# Patient Record
Sex: Male | Born: 1940 | Race: Black or African American | Hispanic: No | Marital: Married | Smoking: Former smoker
Health system: Southern US, Community
[De-identification: ages and names within clinical notes are randomized; demographics above are authoritative.]

## PROBLEM LIST (undated history)

## (undated) DIAGNOSIS — M1990 Unspecified osteoarthritis, unspecified site: Secondary | ICD-10-CM

## (undated) DIAGNOSIS — K219 Gastro-esophageal reflux disease without esophagitis: Secondary | ICD-10-CM

## (undated) HISTORY — DX: Unspecified osteoarthritis, unspecified site: M19.90

---

## 1988-02-17 HISTORY — PX: HERNIA REPAIR: SHX51

## 1998-03-12 ENCOUNTER — Emergency Department (HOSPITAL_COMMUNITY): Admission: EM | Admit: 1998-03-12 | Discharge: 1998-03-12 | Payer: Self-pay | Admitting: Emergency Medicine

## 1999-09-11 ENCOUNTER — Encounter: Payer: Self-pay | Admitting: Emergency Medicine

## 1999-09-11 ENCOUNTER — Emergency Department (HOSPITAL_COMMUNITY): Admission: EM | Admit: 1999-09-11 | Discharge: 1999-09-11 | Payer: Self-pay | Admitting: Emergency Medicine

## 1999-10-06 ENCOUNTER — Encounter: Admission: RE | Admit: 1999-10-06 | Discharge: 1999-10-22 | Payer: Self-pay | Admitting: Occupational Medicine

## 2004-02-09 ENCOUNTER — Emergency Department: Payer: Self-pay | Admitting: Unknown Physician Specialty

## 2004-03-24 HISTORY — PX: HERNIA REPAIR: SHX51

## 2004-04-29 ENCOUNTER — Emergency Department: Payer: Self-pay | Admitting: Emergency Medicine

## 2005-12-09 ENCOUNTER — Emergency Department: Payer: Self-pay | Admitting: Emergency Medicine

## 2006-12-09 ENCOUNTER — Emergency Department (HOSPITAL_COMMUNITY): Admission: EM | Admit: 2006-12-09 | Discharge: 2006-12-09 | Payer: Self-pay | Admitting: Emergency Medicine

## 2006-12-12 ENCOUNTER — Emergency Department: Payer: Self-pay | Admitting: Emergency Medicine

## 2007-04-01 ENCOUNTER — Encounter: Admission: RE | Admit: 2007-04-01 | Discharge: 2007-04-01 | Payer: Self-pay | Admitting: General Surgery

## 2007-04-04 ENCOUNTER — Ambulatory Visit (HOSPITAL_BASED_OUTPATIENT_CLINIC_OR_DEPARTMENT_OTHER): Admission: RE | Admit: 2007-04-04 | Discharge: 2007-04-04 | Payer: Self-pay | Admitting: General Surgery

## 2009-01-26 ENCOUNTER — Emergency Department: Payer: Self-pay | Admitting: Emergency Medicine

## 2009-02-26 ENCOUNTER — Emergency Department: Payer: Self-pay | Admitting: Unknown Physician Specialty

## 2009-03-15 ENCOUNTER — Emergency Department: Payer: Self-pay | Admitting: Emergency Medicine

## 2009-05-12 ENCOUNTER — Emergency Department: Payer: Self-pay | Admitting: Emergency Medicine

## 2009-06-25 ENCOUNTER — Emergency Department: Payer: Self-pay | Admitting: Emergency Medicine

## 2009-11-16 ENCOUNTER — Emergency Department: Payer: Self-pay | Admitting: Internal Medicine

## 2010-06-26 ENCOUNTER — Emergency Department: Payer: Self-pay | Admitting: Emergency Medicine

## 2010-06-28 ENCOUNTER — Emergency Department (HOSPITAL_BASED_OUTPATIENT_CLINIC_OR_DEPARTMENT_OTHER)
Admission: EM | Admit: 2010-06-28 | Discharge: 2010-06-28 | Disposition: A | Discharge status: Home / Self Care | Payer: Medicare Other | Attending: Emergency Medicine | Admitting: Emergency Medicine

## 2010-06-28 DIAGNOSIS — H9209 Otalgia, unspecified ear: Secondary | ICD-10-CM | POA: Insufficient documentation

## 2010-06-28 DIAGNOSIS — H6120 Impacted cerumen, unspecified ear: Secondary | ICD-10-CM | POA: Insufficient documentation

## 2010-06-28 DIAGNOSIS — H6690 Otitis media, unspecified, unspecified ear: Secondary | ICD-10-CM | POA: Insufficient documentation

## 2010-07-01 NOTE — Op Note (Signed)
NAME:  Kevin [name], Kevin [name] NO.:  [id number]   MEDICAL RECORD NO.:  [id number]          PATIENT TYPE:  AMB   LOCATION:  DSC                          FACILITY:  [facility]   PHYSICIAN:  Leonie Man, M.D.   DATE OF BIRTH:  1940-03-01   DATE OF PROCEDURE:  04/04/2007  DATE OF DISCHARGE:  04/04/2007                               OPERATIVE REPORT   PREOPERATIVE DIAGNOSIS:  Left inguinal hernia.   POSTOPERATIVE DIAGNOSIS:  Left direct inguinal hernia.   PROCEDURE:  Left inguinal hernia repair with mesh.   SURGEON:  Leonie Man, M.D.   ASSISTANT:  OR tech.   ANESTHESIA:  General.   FINDINGS:  Direct left inguinal hernia.   SPECIMENS:  None.   The patient returned to the PACU in excellent condition.   NOTE:  The patient is a 70 year old man presenting with large left sided  inguinal hernia.  He had undergone right groin hernia repair in the  remote past.  He comes to the operating room now after risks and  potential benefits of surgery have been discussed.  All questions  answered and consent obtained.   PROCEDURE:  With the patient positioned supinely following induction of  satisfactory general anesthesia, the left groin is prepped and draped to  be included in a sterile operative field.  Transverse incision in lower  abdominal crease deepened through the skin and subcutaneous tissue,  carried down to the external oblique aponeurosis.  This was opened up  through the external inguinal ring and the spermatic cord is then  elevated and held with a Penrose drain.  The ilioinguinal nerve was  protected throughout the course of dissection.  Dissection along the  anterior aspect of the spermatic cord revealed only a small lipoma which  was removed and discarded.  There was no indirect sac.  A large direct  sac was then extending from the pubic tubercle all the way up to the  internal ring, was dissected free from the cord and the sac was  invaginated into the  retroperitoneum.  I repaired the defect with an  onlay patch of polypropylene mesh which was sewn in at the pubic  tubercle, carried up along the conjoined tendon to the internal ring and  again from the pubic tubercle up along the shelving edge of Poupart's  ligament to the internal ring.  The mesh was split so as to allow the  spermatic cord to protrude through the leaflets.  The tails of the  leaflets were then sewn down behind the cord into the internal oblique  muscle.  All areas of dissection again checked for hemostasis, noted to  be dry.  Sponge and instrument counts were verified.  The external  oblique aponeurosis closed over the cord reapproximating the external  inguinal ring with 2-0 Vicryl sutures.  The Scarpa's fascia was closed  with 3-0 Vicryl  and skin closed with 4-0 Monocryl suture and then reinforced with Steri-  Strips.  Sterile dressings were applied.  Anesthetic reversed.  The  patient removed from the operating room to the recovery room in stable  condition.  He tolerated the procedure well.      Leonie Man, M.D.  Electronically Signed     PB/MEDQ  D:  04/04/2007  T:  04/05/2007  Job:  [id number]

## 2010-07-21 ENCOUNTER — Emergency Department: Payer: Self-pay | Admitting: Emergency Medicine

## 2010-09-04 ENCOUNTER — Emergency Department: Payer: Self-pay | Admitting: Emergency Medicine

## 2010-11-07 LAB — CBC
HCT: 39.1
Hemoglobin: 13
MCHC: 33.3
MCV: 83.7
Platelets: 167
RBC: 4.67
RDW: 13.2
WBC: 3.4 — ABNORMAL LOW

## 2010-11-07 LAB — DIFFERENTIAL
Basophils Absolute: 0
Basophils Relative: 1
Eosinophils Absolute: 0.1
Eosinophils Relative: 2
Lymphocytes Relative: 42
Lymphs Abs: 1.4
Monocytes Absolute: 0.3
Monocytes Relative: 9
Neutro Abs: 1.6 — ABNORMAL LOW
Neutrophils Relative %: 46

## 2010-11-07 LAB — BASIC METABOLIC PANEL
BUN: 10
CO2: 26
Calcium: 8.6
Chloride: 107
Creatinine, Ser: 0.93
GFR calc Af Amer: >60
GFR calc non Af Amer: >60
Glucose, Bld: 97
Potassium: 4
Sodium: 138

## 2010-11-20 ENCOUNTER — Emergency Department: Payer: Self-pay | Admitting: *Deleted

## 2011-07-20 ENCOUNTER — Emergency Department: Payer: Self-pay | Admitting: Unknown Physician Specialty

## 2011-10-03 ENCOUNTER — Emergency Department: Payer: Self-pay | Admitting: *Deleted

## 2012-02-20 ENCOUNTER — Emergency Department: Payer: Self-pay | Admitting: Internal Medicine

## 2013-04-17 ENCOUNTER — Emergency Department: Payer: Self-pay | Admitting: Emergency Medicine

## 2013-06-23 ENCOUNTER — Emergency Department: Payer: Self-pay | Admitting: Emergency Medicine

## 2015-05-29 ENCOUNTER — Ambulatory Visit (INDEPENDENT_AMBULATORY_CARE_PROVIDER_SITE_OTHER): Payer: Medicare HMO | Admitting: General Surgery

## 2015-05-29 ENCOUNTER — Encounter: Payer: Self-pay | Admitting: General Surgery

## 2015-05-29 VITALS — BP 138/68 | HR 70 | Resp 14 | Ht 69.0 in | Wt 162.0 lb

## 2015-05-29 DIAGNOSIS — K921 Melena: Secondary | ICD-10-CM

## 2015-05-29 LAB — POC HEMOCCULT BLD/STL (OFFICE/1-CARD/DIAGNOSTIC): Fecal Occult Blood, POC: POSITIVE — AB

## 2015-05-29 MED ORDER — POLYETHYLENE GLYCOL 3350 17 GM/SCOOP PO POWD: ORAL | Status: DC

## 2015-05-29 NOTE — Patient Instructions (Addendum)
Colonoscopy A colonoscopy is an exam to look at the entire large intestine (colon). This exam can help find problems such as tumors, polyps, inflammation, and areas of bleeding. The exam takes about 1 hour.  LET [facility]OUR [facility]ARE PROVIDER KNOW ABOUT:   Any allergies you have.  All medicines you are taking, including vitamins, herbs, eye drops, creams, and over-the-counter medicines.  Previous problems you or members of your family have had with the use of anesthetics.  Any blood disorders you have.  Previous surgeries you have had.  Medical conditions you have. RISKS AND COMPLICATIONS  Generally, this is a safe procedure. However, as with any procedure, complications can occur. Possible complications include:  Bleeding.  Tearing or rupture of the colon wall.  Reaction to medicines given during the exam.  Infection (rare). BEFORE THE PROCEDURE   Ask your health care provider about changing or stopping your regular medicines.  You may be prescribed an oral bowel prep. This involves drinking a large amount of medicated liquid, starting the day before your procedure. The liquid will cause you to have multiple loose stools until your stool is almost clear or light green. This cleans out your colon in preparation for the procedure.  Do not eat or drink anything else once you have started the bowel prep, unless your health care provider tells you it is safe to do so.  Arrange for someone to drive you home after the procedure. PROCEDURE   You will be given medicine to help you relax (sedative).  You will lie on your side with your knees bent.  A long, flexible tube with a light and camera on the end (colonoscope) will be inserted through the rectum and into the colon. The camera sends video back to a computer screen as it moves through the colon. The colonoscope also releases carbon dioxide gas to inflate the colon. This helps your health care provider see the area better.  During  the exam, your health care provider may take a small tissue sample (biopsy) to be examined under a microscope if any abnormalities are found.  The exam is finished when the entire colon has been viewed. AFTER THE PROCEDURE   Do not drive for 24 hours after the exam.  You may have a small amount of blood in your stool.  You may pass moderate amounts of gas and have mild abdominal cramping or bloating. This is caused by the gas used to inflate your colon during the exam.  Ask when your test results will be ready and how you will get your results. Make sure you get your test results.   This information is not intended to replace advice given to you by your health care provider. Make sure you discuss any questions you have with your health care provider.   Document Released: 01/31/2000 Document Revised: 11/23/2012 Document Reviewed: 10/10/2012 Elsevier Interactive Patient Education [employer]2016 Elsevier Inc.  Patient [facility]

## 2015-05-29 NOTE — Progress Notes (Signed)
Patient ID: Kevin [name], male   DOB: 05-Sep-1940, 75 y.o.   MRN: [id number]  Chief Complaint  Patient presents with  . Other    blood in stool    HPI Kevin [name] is a 75 y.o. male here due to blood in stool. He first noticed this on Saturday April 8th. He reports noticing it when he wiped. He also reports some blood in the toilet bowl. He reports no pain, just some stomach growling. He has had this every time his bowels move since then. No prior history of GI issues. Has not had a colonoscopy in more than 10 yrs. I have reviewed the history of present illness with the patient.  HPI  Past Medical History  Diagnosis Date  . Arthritis     Past Surgical History  Procedure Laterality Date  . Hernia repair Left 03/24/2004    inguinal     Family History  Problem Relation Age of Onset  . Ovarian cancer Mother   . Alcohol abuse Father     Social History Social History  Substance Use Topics  . Smoking status: Former Smoker    Quit date: 02/16/2009  . Smokeless tobacco: Never Used  . Alcohol Use: 0.0 oz/week    0 Standard drinks or equivalent per week    Allergies  Allergen Reactions  . Shellfish Allergy     Current Outpatient Prescriptions  Medication Sig Dispense Refill  . meloxicam (MOBIC) 15 MG tablet Take 15 mg by mouth daily.  0  . mirtazapine (REMERON) 15 MG tablet Take 15 mg by mouth at bedtime.  0  . polyethylene glycol powder (GLYCOLAX/MIRALAX) powder 255 grams one bottle for colonoscopy prep 255 g 0   No current facility-administered medications for this visit.    Review of Systems Review of Systems  Constitutional: Negative.   Respiratory: Negative.   Cardiovascular: Negative.   Gastrointestinal: Positive for blood in stool. Negative for nausea, vomiting, abdominal pain, diarrhea, constipation, abdominal distention, anal bleeding and rectal pain.    Blood pressure 138/68, pulse 70, resp. rate 14, height 5' 9" (1.753 m), weight 162 lb (73.483  kg).  Physical Exam Physical Exam  Constitutional: He is oriented to person, place, and time. He appears well-developed and well-nourished.  Eyes: Conjunctivae are normal. No scleral icterus.  Neck: Neck supple.  Cardiovascular: Normal rate, regular rhythm and normal heart sounds.   Pulmonary/Chest: Effort normal and breath sounds normal.  Abdominal: Soft. Normal appearance and bowel sounds are normal. There is no hepatomegaly. There is no tenderness. No hernia.  Genitourinary: Rectal exam shows no external hemorrhoid and no internal hemorrhoid. Guaiac positive stool.  Digital exam- no palpable nodule or mass. Stool was soft, maroon colored- heme pos.  Lymphadenopathy:    He has no cervical adenopathy.  Neurological: He is alert and oriented to person, place, and time.  Skin: Skin is warm and dry.  Psychiatric: He has a normal mood and affect.    Data Reviewed Notes reviewed   Assessment    Likely diverticular bleed. Pt will benefit with colonoscopy. Advised on this and he is agreeable.  He has not had any recent labs- will check CBC, met C.     Plan   Colonoscopy with possible biopsy/polypectomy prn: Information regarding the procedure, including its potential risks and complications (including but not limited to perforation of the bowel, which may require emergency surgery to repair, and bleeding) was verbally given to the patient. Educational information regarding lower intestinal endoscopy was  given to the patient. Written instructions for how to complete the bowel prep using Miralax were provided. The importance of drinking ample fluids to avoid dehydration as a result of the prep emphasized.     Patient has been scheduled for a colonoscopy on 06-05-15 at [facility].   PCP: Nicky Pugh This has been scribed by Lesly Rubenstein LPN    Christene Lye 05/30/2015, 3:08 PM

## 2015-05-30 ENCOUNTER — Encounter: Payer: Self-pay | Admitting: General Surgery

## 2015-05-30 ENCOUNTER — Telehealth: Payer: Self-pay | Admitting: *Deleted

## 2015-05-30 LAB — CBC WITH DIFFERENTIAL/PLATELET
BASOS: 0 %
Basophils Absolute: 0 10*3/uL (ref 0.0–0.2)
EOS (ABSOLUTE): 0 10*3/uL (ref 0.0–0.4)
EOS: 1 %
HEMATOCRIT: 41 % (ref 37.5–51.0)
HEMOGLOBIN: 13.2 g/dL (ref 12.6–17.7)
Immature Grans (Abs): 0 10*3/uL (ref 0.0–0.1)
Immature Granulocytes: 0 %
LYMPHS ABS: 1.7 10*3/uL (ref 0.7–3.1)
Lymphs: 34 %
MCH: 27.7 pg (ref 26.6–33.0)
MCHC: 32.2 g/dL (ref 31.5–35.7)
MCV: 86 fL (ref 79–97)
MONOS ABS: 0.5 10*3/uL (ref 0.1–0.9)
Monocytes: 11 %
Neutrophils Absolute: 2.8 10*3/uL (ref 1.4–7.0)
Neutrophils: 54 %
Platelets: 233 10*3/uL (ref 150–379)
RBC: 4.76 x10E6/uL (ref 4.14–5.80)
RDW: 13.2 % (ref 12.3–15.4)
WBC: 5.1 10*3/uL (ref 3.4–10.8)

## 2015-05-30 LAB — COMPREHENSIVE METABOLIC PANEL
ALBUMIN: 4.7 g/dL (ref 3.5–4.8)
ALK PHOS: 49 IU/L (ref 39–117)
ALT: 16 IU/L (ref 0–44)
AST: 22 IU/L (ref 0–40)
Albumin/Globulin Ratio: 1.9 (ref 1.2–2.2)
BUN/Creatinine Ratio: 13 (ref 10–24)
BUN: 15 mg/dL (ref 8–27)
Bilirubin Total: 0.5 mg/dL (ref 0.0–1.2)
CALCIUM: 9.7 mg/dL (ref 8.6–10.2)
CO2: 26 mmol/L (ref 18–29)
CREATININE: 1.13 mg/dL (ref 0.76–1.27)
Chloride: 102 mmol/L (ref 96–106)
GFR calc Af Amer: 74 mL/min/{1.73_m2} (ref >59)
GFR calc non Af Amer: 64 mL/min/{1.73_m2} (ref >59)
Globulin, Total: 2.5 g/dL (ref 1.5–4.5)
Glucose: 88 mg/dL (ref 65–99)
Potassium: 4.5 mmol/L (ref 3.5–5.2)
Sodium: 143 mmol/L (ref 134–144)
Total Protein: 7.2 g/dL (ref 6.0–8.5)

## 2015-05-30 NOTE — Telephone Encounter (Signed)
Notified patient as instructed, patient pleased. Discussed follow-up appointments, patient agrees  

## 2015-05-30 NOTE — Telephone Encounter (Signed)
-----   Message from Kieth BrightlySeeplaputhur G Sankar, MD sent at 05/30/2015  7:53 AM EDT ----- Inform pt labs are normal.

## 2015-05-30 NOTE — Progress Notes (Signed)
Quick Note:  Inform pt labs are normal. ______ 

## 2015-05-30 NOTE — Telephone Encounter (Signed)
-----   Message from Seeplaputhur G Sankar, MD sent at 05/30/2015  7:53 AM EDT ----- Inform pt labs are normal. 

## 2015-06-05 ENCOUNTER — Encounter
Admission: RE | Disposition: A | Discharge status: Home / Self Care | Payer: Self-pay | Source: Ambulatory Visit | Attending: General Surgery

## 2015-06-05 ENCOUNTER — Ambulatory Visit: Payer: Medicare HMO | Admitting: Anesthesiology

## 2015-06-05 ENCOUNTER — Encounter: Payer: Self-pay | Admitting: *Deleted

## 2015-06-05 ENCOUNTER — Ambulatory Visit
Admission: RE | Admit: 2015-06-05 | Discharge: 2015-06-05 | Disposition: A | Discharge status: Home / Self Care | Payer: Medicare HMO | Source: Ambulatory Visit | Attending: General Surgery | Admitting: General Surgery

## 2015-06-05 DIAGNOSIS — K5730 Diverticulosis of large intestine without perforation or abscess without bleeding: Secondary | ICD-10-CM | POA: Diagnosis not present

## 2015-06-05 DIAGNOSIS — Z811 Family history of alcohol abuse and dependence: Secondary | ICD-10-CM | POA: Diagnosis not present

## 2015-06-05 DIAGNOSIS — Z791 Long term (current) use of non-steroidal anti-inflammatories (NSAID): Secondary | ICD-10-CM | POA: Insufficient documentation

## 2015-06-05 DIAGNOSIS — Z8041 Family history of malignant neoplasm of ovary: Secondary | ICD-10-CM | POA: Diagnosis not present

## 2015-06-05 DIAGNOSIS — Z9889 Other specified postprocedural states: Secondary | ICD-10-CM | POA: Diagnosis not present

## 2015-06-05 DIAGNOSIS — M1990 Unspecified osteoarthritis, unspecified site: Secondary | ICD-10-CM | POA: Diagnosis not present

## 2015-06-05 DIAGNOSIS — K921 Melena: Secondary | ICD-10-CM | POA: Diagnosis not present

## 2015-06-05 DIAGNOSIS — Z87891 Personal history of nicotine dependence: Secondary | ICD-10-CM | POA: Diagnosis not present

## 2015-06-05 DIAGNOSIS — Z79899 Other long term (current) drug therapy: Secondary | ICD-10-CM | POA: Insufficient documentation

## 2015-06-05 DIAGNOSIS — K625 Hemorrhage of anus and rectum: Secondary | ICD-10-CM | POA: Diagnosis present

## 2015-06-05 DIAGNOSIS — Z91013 Allergy to seafood: Secondary | ICD-10-CM | POA: Diagnosis not present

## 2015-06-05 HISTORY — PX: COLONOSCOPY WITH PROPOFOL: SHX5780

## 2015-06-05 SURGERY — COLONOSCOPY WITH PROPOFOL
Anesthesia: General

## 2015-06-05 MED ORDER — PROPOFOL 10 MG/ML IV BOLUS
INTRAVENOUS | Status: DC | PRN
  Administered 2015-06-05: 75 mg via INTRAVENOUS

## 2015-06-05 MED ORDER — SODIUM CHLORIDE 0.9 % IV SOLN
INTRAVENOUS | Status: DC
  Administered 2015-06-05: 1000 mL via INTRAVENOUS

## 2015-06-05 MED ORDER — PROPOFOL 500 MG/50ML IV EMUL
INTRAVENOUS | Status: DC | PRN
  Administered 2015-06-05: 100 ug/kg/min via INTRAVENOUS

## 2015-06-05 MED ORDER — LACTATED RINGERS IV SOLN
INTRAVENOUS | Status: DC | PRN
  Administered 2015-06-05: 09:00:00 via INTRAVENOUS

## 2015-06-05 NOTE — H&P (View-Only) (Signed)
Patient ID: Kevin [name], male   DOB: 09/03/1940, 74 y.o.   MRN: [id number]  Chief Complaint  Patient presents with  . Other    blood in stool    HPI Kevin [name] is a 74 y.o. male here due to blood in stool. He first noticed this on Saturday April 8th. He reports noticing it when he wiped. He also reports some blood in the toilet bowl. He reports no pain, just some stomach growling. He has had this every time his bowels move since then. No prior history of GI issues. Has not had a colonoscopy in more than 10 yrs. I have reviewed the history of present illness with the patient.  HPI  Past Medical History  Diagnosis Date  . Arthritis     Past Surgical History  Procedure Laterality Date  . Hernia repair Left 03/24/2004    inguinal     Family History  Problem Relation Age of Onset  . Ovarian cancer Mother   . Alcohol abuse Father     Social History Social History  Substance Use Topics  . Smoking status: Former Smoker    Quit date: 02/16/2009  . Smokeless tobacco: Never Used  . Alcohol Use: 0.0 oz/week    0 Standard drinks or equivalent per week    Allergies  Allergen Reactions  . Shellfish Allergy     Current Outpatient Prescriptions  Medication Sig Dispense Refill  . meloxicam (MOBIC) 15 MG tablet Take 15 mg by mouth daily.  0  . mirtazapine (REMERON) 15 MG tablet Take 15 mg by mouth at bedtime.  0  . polyethylene glycol powder (GLYCOLAX/MIRALAX) powder 255 grams one bottle for colonoscopy prep 255 [name] 0   No current facility-administered medications for this visit.    Review of Systems Review of Systems  Constitutional: Negative.   Respiratory: Negative.   Cardiovascular: Negative.   Gastrointestinal: Positive for blood in stool. Negative for nausea, vomiting, abdominal pain, diarrhea, constipation, abdominal distention, anal bleeding and rectal pain.    Blood pressure 138/68, pulse 70, resp. rate 14, height 5' 9" (1.753 m), weight 162 lb (73.483  kg).  Physical Exam Physical Exam  Constitutional: He is oriented to person, place, and time. He appears well-developed and well-nourished.  Eyes: Conjunctivae are normal. No scleral icterus.  Neck: Neck supple.  Cardiovascular: Normal rate, regular rhythm and normal heart sounds.   Pulmonary/Chest: Effort normal and breath sounds normal.  Abdominal: Soft. Normal appearance and bowel sounds are normal. There is no hepatomegaly. There is no tenderness. No hernia.  Genitourinary: Rectal exam shows no external hemorrhoid and no internal hemorrhoid. Guaiac positive stool.  Digital exam- no palpable nodule or mass. Stool was soft, maroon colored- heme pos.  Lymphadenopathy:    He has no cervical adenopathy.  Neurological: He is alert and oriented to person, place, and time.  Skin: Skin is warm and dry.  Psychiatric: He has a normal mood and affect.    Data Reviewed Notes reviewed   Assessment    Likely diverticular bleed. Pt will benefit with colonoscopy. Advised on this and he is agreeable.  He has not had any recent labs- will check CBC, met C.     Plan   Colonoscopy with possible biopsy/polypectomy prn: Information regarding the procedure, including its potential risks and complications (including but not limited to perforation of the bowel, which may require emergency surgery to repair, and bleeding) was verbally given to the patient. Educational information regarding lower intestinal endoscopy was   given to the patient. Written instructions for how to complete the bowel prep using Miralax were provided. The importance of drinking ample fluids to avoid dehydration as a result of the prep emphasized.     Patient has been scheduled for a colonoscopy on 06-05-15 at [facility].   PCP: Ernest Eason This has been scribed by Caryl-Lyn M Kennedy LPN    Kevin [name] 05/30/2015, 3:08 PM    

## 2015-06-05 NOTE — Anesthesia Postprocedure Evaluation (Signed)
Anesthesia Post Note  Patient: Kevin [name]  Procedure(s) Performed: Procedure(s) (LRB): COLONOSCOPY WITH PROPOFOL (N/A)  Patient location during evaluation: Endoscopy Anesthesia Type: General Level of consciousness: awake and alert Pain management: pain level controlled Vital Signs Assessment: post-procedure vital signs reviewed and stable Respiratory status: spontaneous breathing, nonlabored ventilation, respiratory function stable and patient connected to nasal cannula oxygen Cardiovascular status: blood pressure returned to baseline and stable Postop Assessment: no signs of nausea or vomiting Anesthetic complications: no    Last Vitals:  Filed Vitals:   06/05/15 0920 06/05/15 0930  BP: 118/77 124/84  Pulse: 58 58  Temp:    Resp: 15 15    Last Pain: There were no vitals filed for this visit.               Lenard SimmerAndrew Bora Broner

## 2015-06-05 NOTE — Transfer of Care (Signed)
Immediate Anesthesia Transfer of Care Note  Patient: Kevin [name]  Procedure(s) Performed: Procedure(s): COLONOSCOPY WITH PROPOFOL (N/A)  Patient Location: PACU and Endoscopy Unit  Anesthesia Type:General  Level of Consciousness: awake and alert   Airway & Oxygen Therapy: Patient Spontanous Breathing  Post-op Assessment: Report given to RN and Post -op Vital signs reviewed and stable  Post vital signs: Reviewed and stable  Last Vitals:  Filed Vitals:   06/05/15 0811  BP: 109/56  Pulse: 45  Temp: 36.6 C  Resp: 14    Complications: No apparent anesthesia complications

## 2015-06-05 NOTE — Anesthesia Preprocedure Evaluation (Signed)
Anesthesia Evaluation  Patient identified by MRN, date of birth, ID band Patient awake    Reviewed: Allergy & Precautions, H&P , NPO status , Patient's Chart, lab work & pertinent test results, reviewed documented beta blocker date and time   History of Anesthesia Complications Negative for: history of anesthetic complications  Airway Mallampati: II  TM Distance: >3 FB Neck ROM: full    Dental no notable dental hx. (+) Poor Dentition   Pulmonary neg pulmonary ROS, former smoker,    Pulmonary exam normal breath sounds clear to auscultation       Cardiovascular Exercise Tolerance: Good negative cardio ROS Normal cardiovascular exam Rhythm:regular Rate:Normal     Neuro/Psych negative neurological ROS  negative psych ROS   GI/Hepatic negative GI ROS, Neg liver ROS,   Endo/Other  negative endocrine ROS  Renal/GU negative Renal ROS  negative genitourinary   Musculoskeletal   Abdominal   Peds  Hematology negative hematology ROS (+)   Anesthesia Other Findings Past Medical History:   Arthritis                                                    Reproductive/Obstetrics negative OB ROS                             Anesthesia Physical Anesthesia Plan  ASA: II  Anesthesia Plan: General   Post-op Pain Management:    Induction:   Airway Management Planned:   Additional Equipment:   Intra-op Plan:   Post-operative Plan:   Informed Consent: I have reviewed the patients History and Physical, chart, labs and discussed the procedure including the risks, benefits and alternatives for the proposed anesthesia with the patient or authorized representative who has indicated his/her understanding and acceptance.   Dental Advisory Given  Plan Discussed with: Anesthesiologist, CRNA and Surgeon  Anesthesia Plan Comments:         Anesthesia Quick Evaluation

## 2015-06-05 NOTE — Op Note (Signed)
[facility] Patient Name: Kevin [name] Procedure Date: 06/05/2015 8:34 AM MRN: [id number] Account #: [id number] Date of Birth: 09/10/1940 Admit Type: Outpatient Age: 75 [facility] Gender: Male Note Status: Finalized Procedure:            Colonoscopy Indications:          Rectal bleeding Providers:            Seeplaputhur G. Evette CristalSankar, MD Referring MD:         Serita ShellerErnest B. Maryellen PileEason, MD (Referring MD) Medicines:            General Anesthesia Complications:        No immediate complications. Procedure:            Pre-Anesthesia Assessment:                       - General anesthesia under the supervision of an                        anesthesiologist was determined to be medically                        necessary for this procedure based on review of the                        patient's medical history, medications, and prior                        anesthesia history.                       After obtaining informed consent, the colonoscope was                        passed under direct vision. Throughout the procedure,                        the patient's blood pressure, pulse, and oxygen                        saturations were monitored continuously. The                        Colonoscope was introduced through the anus and                        advanced to the the cecum, identified by the ileocecal                        valve. The colonoscopy was performed without                        difficulty. The patient tolerated the procedure well.                        The quality of the bowel preparation was excellent. Findings:      The perianal and digital rectal examinations were normal.      Multiple small and large-mouthed diverticula were found in the sigmoid       colon and ascending colon.      The exam was otherwise without abnormality. 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                       - The examination was otherwise normal.                       - No specimens collected. Recommendation:       - Discharge patient to home.                       - Use fiber, for example Citrucel, Fibercon, Konsyl or                        Metamucil.                       - Return to endoscopist PRN. Procedure Code(s):    --- Professional ---                       [phone], Colonoscopy, flexible; diagnostic, including                        collection of specimen(s) by brushing or washing, when                        performed (separate procedure) Diagnosis Code(s):    --- Professional ---                       K62.5, Hemorrhage of anus and rectum                       K57.30, Diverticulosis of large intestine without                        perforation or abscess without bleeding CPT copyright 2016 American Medical Association. All rights reserved. The codes documented in this report are preliminary and upon coder review may  be revised to meet current compliance requirements. Kieth Brightly, MD 06/05/2015 9:39:54 AM This report has been signed electronically. Number of Addenda: 0 Note Initiated On: 06/05/2015 8:34 AM Scope Withdrawal Time: 0 hours 6 minutes 0 seconds  Total Procedure Duration: 0 hours 15 minutes 38 seconds       [facility]

## 2015-06-05 NOTE — Interval H&P Note (Signed)
History and Physical Interval Note:  06/05/2015 8:33 AM  Kevin [name]  has presented today for surgery, with the diagnosis of BLOOD IN STOOL  The various methods of treatment have been discussed with the patient and family. After consideration of risks, benefits and other options for treatment, the patient has consented to  Procedure(s): COLONOSCOPY WITH PROPOFOL (N/A) as a surgical intervention .  The patient's history has been reviewed, patient examined, no change in status, stable for surgery.  I have reviewed the patient's chart and labs.  Questions were answered to the patient's satisfaction.     SANKAR,SEEPLAPUTHUR G

## 2015-10-09 DIAGNOSIS — K921 Melena: Secondary | ICD-10-CM

## 2016-05-12 DIAGNOSIS — Y939 Activity, unspecified: Secondary | ICD-10-CM | POA: Diagnosis not present

## 2016-05-12 DIAGNOSIS — W57XXXA Bitten or stung by nonvenomous insect and other nonvenomous arthropods, initial encounter: Secondary | ICD-10-CM | POA: Insufficient documentation

## 2016-05-12 DIAGNOSIS — S80869A Insect bite (nonvenomous), unspecified lower leg, initial encounter: Secondary | ICD-10-CM | POA: Insufficient documentation

## 2016-05-12 DIAGNOSIS — Y999 Unspecified external cause status: Secondary | ICD-10-CM | POA: Insufficient documentation

## 2016-05-12 DIAGNOSIS — Y929 Unspecified place or not applicable: Secondary | ICD-10-CM | POA: Diagnosis not present

## 2016-05-12 DIAGNOSIS — Z87891 Personal history of nicotine dependence: Secondary | ICD-10-CM | POA: Insufficient documentation

## 2016-05-12 NOTE — ED Triage Notes (Signed)
Patient ambulatory to triage with steady gait, without difficulty or distress noted; pt reports pulling a tick off his leg this evening; pt has tick intact and alive in a medicine cup; pt denies any c/o at this time

## 2016-05-13 ENCOUNTER — Emergency Department
Admission: EM | Admit: 2016-05-13 | Discharge: 2016-05-13 | Disposition: A | Discharge status: Home / Self Care | Payer: Medicare HMO | Attending: Emergency Medicine | Admitting: Emergency Medicine

## 2017-08-20 DIAGNOSIS — Z5321 Procedure and treatment not carried out due to patient leaving prior to being seen by health care provider: Secondary | ICD-10-CM | POA: Diagnosis not present

## 2017-08-20 DIAGNOSIS — M79671 Pain in right foot: Secondary | ICD-10-CM | POA: Diagnosis not present

## 2017-08-20 DIAGNOSIS — M19071 Primary osteoarthritis, right ankle and foot: Secondary | ICD-10-CM | POA: Diagnosis not present

## 2017-08-21 DIAGNOSIS — R21 Rash and other nonspecific skin eruption: Secondary | ICD-10-CM | POA: Diagnosis not present

## 2017-08-21 DIAGNOSIS — M19071 Primary osteoarthritis, right ankle and foot: Secondary | ICD-10-CM | POA: Diagnosis not present

## 2017-08-21 DIAGNOSIS — M1990 Unspecified osteoarthritis, unspecified site: Secondary | ICD-10-CM | POA: Diagnosis not present

## 2017-08-21 DIAGNOSIS — M79671 Pain in right foot: Secondary | ICD-10-CM | POA: Diagnosis not present

## 2017-08-21 DIAGNOSIS — Z760 Encounter for issue of repeat prescription: Secondary | ICD-10-CM | POA: Diagnosis not present

## 2017-08-21 DIAGNOSIS — M7661 Achilles tendinitis, right leg: Secondary | ICD-10-CM | POA: Diagnosis not present

## 2017-08-29 DIAGNOSIS — L03116 Cellulitis of left lower limb: Secondary | ICD-10-CM | POA: Diagnosis not present

## 2017-08-29 DIAGNOSIS — L03119 Cellulitis of unspecified part of limb: Secondary | ICD-10-CM | POA: Diagnosis not present

## 2017-08-29 DIAGNOSIS — B353 Tinea pedis: Secondary | ICD-10-CM | POA: Diagnosis not present

## 2017-08-29 DIAGNOSIS — L03115 Cellulitis of right lower limb: Secondary | ICD-10-CM | POA: Diagnosis not present

## 2017-08-29 DIAGNOSIS — R21 Rash and other nonspecific skin eruption: Secondary | ICD-10-CM | POA: Diagnosis not present

## 2017-11-01 DIAGNOSIS — R69 Illness, unspecified: Secondary | ICD-10-CM | POA: Diagnosis not present

## 2017-11-02 DIAGNOSIS — R69 Illness, unspecified: Secondary | ICD-10-CM | POA: Diagnosis not present

## 2017-12-06 DIAGNOSIS — Z87891 Personal history of nicotine dependence: Secondary | ICD-10-CM | POA: Diagnosis not present

## 2017-12-06 DIAGNOSIS — R21 Rash and other nonspecific skin eruption: Secondary | ICD-10-CM | POA: Diagnosis not present

## 2017-12-06 DIAGNOSIS — L089 Local infection of the skin and subcutaneous tissue, unspecified: Secondary | ICD-10-CM | POA: Diagnosis not present

## 2017-12-06 DIAGNOSIS — L309 Dermatitis, unspecified: Secondary | ICD-10-CM | POA: Diagnosis not present

## 2017-12-06 DIAGNOSIS — M2550 Pain in unspecified joint: Secondary | ICD-10-CM | POA: Diagnosis not present

## 2017-12-21 DIAGNOSIS — R69 Illness, unspecified: Secondary | ICD-10-CM | POA: Diagnosis not present

## 2018-02-10 DIAGNOSIS — Z0100 Encounter for examination of eyes and vision without abnormal findings: Secondary | ICD-10-CM | POA: Diagnosis not present

## 2018-02-10 DIAGNOSIS — H524 Presbyopia: Secondary | ICD-10-CM | POA: Diagnosis not present

## 2018-02-22 ENCOUNTER — Encounter: Payer: Self-pay | Admitting: Family Medicine

## 2018-02-22 ENCOUNTER — Other Ambulatory Visit: Payer: Self-pay | Admitting: Family Medicine

## 2018-02-22 ENCOUNTER — Ambulatory Visit (INDEPENDENT_AMBULATORY_CARE_PROVIDER_SITE_OTHER): Payer: Medicare HMO | Admitting: Family Medicine

## 2018-02-22 VITALS — BP 119/66 | HR 66 | Temp 98.4°F | Resp 16 | Ht 68.0 in | Wt 167.0 lb

## 2018-02-22 DIAGNOSIS — Z7689 Persons encountering health services in other specified circumstances: Secondary | ICD-10-CM | POA: Diagnosis not present

## 2018-02-22 DIAGNOSIS — N529 Male erectile dysfunction, unspecified: Secondary | ICD-10-CM

## 2018-02-22 DIAGNOSIS — M879 Osteonecrosis, unspecified: Secondary | ICD-10-CM | POA: Diagnosis not present

## 2018-02-22 DIAGNOSIS — M150 Primary generalized (osteo)arthritis: Secondary | ICD-10-CM

## 2018-02-22 DIAGNOSIS — M25552 Pain in left hip: Secondary | ICD-10-CM

## 2018-02-22 DIAGNOSIS — R630 Anorexia: Secondary | ICD-10-CM

## 2018-02-22 DIAGNOSIS — R972 Elevated prostate specific antigen [PSA]: Secondary | ICD-10-CM

## 2018-02-22 DIAGNOSIS — K219 Gastro-esophageal reflux disease without esophagitis: Secondary | ICD-10-CM | POA: Insufficient documentation

## 2018-02-22 DIAGNOSIS — R1013 Epigastric pain: Secondary | ICD-10-CM | POA: Diagnosis not present

## 2018-02-22 DIAGNOSIS — M159 Polyosteoarthritis, unspecified: Secondary | ICD-10-CM

## 2018-02-22 DIAGNOSIS — G8929 Other chronic pain: Secondary | ICD-10-CM | POA: Diagnosis not present

## 2018-02-22 DIAGNOSIS — M8949 Other hypertrophic osteoarthropathy, multiple sites: Secondary | ICD-10-CM | POA: Insufficient documentation

## 2018-02-22 MED ORDER — MIRTAZAPINE 15 MG PO TABS: 15.0000 mg | ORAL_TABLET | Freq: Every day | ORAL | 1 refills | Status: DC

## 2018-02-22 MED ORDER — SILDENAFIL CITRATE 20 MG PO TABS: ORAL_TABLET | ORAL | 5 refills | Status: DC

## 2018-02-22 MED ORDER — OMEPRAZOLE 20 MG PO CPDR: 20.0000 mg | DELAYED_RELEASE_CAPSULE | Freq: Every day | ORAL | 2 refills | Status: DC

## 2018-02-22 NOTE — Assessment & Plan Note (Signed)
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 - Additionally with OA/DJD other joints  Plan - Discussion on OA/DJD progression and therapy - he opts for avoiding surgical intervention, despite previous recommendation to him by ED provider in past - Start with more aggressive OA management with analgesia with regular Tylenol Ext Str dosing 500mg  x 1-2 per dose up to TID often vs PRN - May continue rare NSAID Aleve PRN as well - Continue supportive knee brace and other conservative efforts - Follow-up if not improving, consider other rx therapy such as Gabapentin, Muscle relaxant, among other pain options, future may need repeat imaging and consider PT vs refer to Ortho for 2nd opinion

## 2018-02-22 NOTE — Assessment & Plan Note (Signed)
Uncertain exact etiology, does not seem to be related to mood or depression May be nutritional deficiency if not adhering to regular meals or balanced diet - Weight remains stable in past few years  Plan Previously on Mirtazapine - will re order 15mg  daily by his request today to improve appetite - May add OTC meal supplement such as Boost, failed ensure before

## 2018-02-22 NOTE — Assessment & Plan Note (Signed)
Concerning with history of elevated PSA 6-7 from last PCP Dr Maryellen PileEason >1 year ago was advised just to surveillance on PSA, as they did not recommend intervention by patient report - No known fam history of prostate CA  Plan Discussion on options - will request records, also plan to return in 3 months for yearly check up with labs including PSA - will trend result, if still >4 but < 10 will likely still refer to Urology for 2nd opinion given that he does not endorse significant BPH LUTS symptoms - Offered DRE will proceed with this at next visit

## 2018-02-22 NOTE — Assessment & Plan Note (Signed)
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 

## 2018-02-22 NOTE — Assessment & Plan Note (Signed)
Clinically with episodic RUQ or upper abdominal pain/discomfort seems related to GERD symptoms, uncontrolled not on any PPI or H2 blocker or antacid therapy - No chronic history of this problem - No significant GI red flag symptoms  Plan - Discussed benefit of balanced diet, regular meals, avoid skipping - Limit trigger foods - Start on PPI trial Omeprazole 20mg  daily PRN before breakfast, may need regular dosing course in future - Consider add carafate PRN - Follow-up if need, consider GI consult

## 2018-02-22 NOTE — Assessment & Plan Note (Addendum)
See A&P for Osteoarthritis - Primary underlying etiology for L hip pain chronic 

## 2018-02-22 NOTE — Patient Instructions (Addendum)
Thank you for coming to the office today.  Please schedule and return for a NURSE ONLY VISIT for VACCINE - Need Prevnar-13 (initial Pneumonia Vaccine >age 77) - then 1 year due for Pneumovax-23 - 2nd final dose  For hip arthritis and pain  Recommend to start taking Tylenol Extra Strength 500mg  tabs - take 1 to 2 tabs per dose (max 1000mg ) every 6-8 hours for pain (take regularly, don't skip a dose for next 7 days), max 24 hour daily dose is 6 tablets or 3000mg . In the future you can repeat the same everyday Tylenol course for 1-2 weeks at a time.  - This is safe to take with anti-inflammatory medicines that you are taking on occasion with Aleve PM in the evening  In future we can consider other meds, or referral if needed  -------  Start taking Omeprazole 20mg  daily as needed before breakfast for stomach acid and abdominal discomfort. Let me know if effective or if we need to change med.  Sent rx for Sildenafil to [facility] Mirtazapine 15mg  nightly for improved appetite - also try Boost over the counter for meal supplement  DUE for FASTING BLOOD WORK (no food or drink after midnight before the lab appointment, only water or coffee without cream/sugar on the morning of)  SCHEDULE "Lab Only" visit in the morning at the clinic for lab draw in 3 MONTHS   - Make sure Lab Only appointment is at about 1 week before your next appointment, so that results will be available  For Lab Results, once available within 2-3 days of blood draw, you can can log in to MyChart online to view your results and a brief explanation. Also, we can discuss results at next follow-up visit.   Please schedule a Follow-up Appointment to: Return in about 3 months (around 05/24/2018) for Annual Physical.  If you have any other questions or concerns, please feel free to call the office or send a message through MyChart. You may also schedule an earlier appointment if necessary.  Additionally, you may be  receiving a survey about your experience at our office within a few days to 1 week by e-mail or mail. We value your feedback.  Saralyn Pilar, DO [facility], [place]

## 2018-02-22 NOTE — Progress Notes (Signed)
Subjective:    Patient ID: Kevin [name], male    DOB: 09-17-1940, 78 y.o.   MRN: [id number]  Kevin [name] is a 78 y.o. male presenting on 02/22/2018 for Establish Care; Gastroesophageal Reflux (abdominal pain after eat); and Erectile Dysfunction  Here to establish care. Previous PCP was Dr Maryellen Pile locally, now patient seeking new PCP.  HPI   Osteoarthritis multiple joints, Left Hip Reports chronic problem with joint pain and stiffness,  - He used to take Ibuprofen 800mg  PRN from prior PCP. Instead  - he is able to function and walk well all day with a Left Knee brace and good support Sketcher's shoes  Weight Loss / Reduced Appetite / Normal Weight BMI >25 / GERD Abdominal Pain History overactive during day he attributes not always eating regularly. He used to take Mirtazapine for appetite, would like to restart this medication. - Admits some episodic RUQ to epigastric pain episodic with meals seems to improve, worse on empty stomach, never on PPI or acid reflux med before - He has tried Ensure did not do well it caused upset stomach sometimes, he would like to try Boost  Erectile Dysfunction - He has been treated in the past on Sildenafil 20mg  with good results taking 2 pills per dose regularly with good results, in past tried Viagra name brand 100mg  with limit.  He works as a Merchandiser, retail for a Pensions consultant in [employer] Maintenance: UTD Flu vaccine 11/2017  Prostate CA Screening: Prior PSA reported elevated >6-7 on last check by PCP Dr Maryellen Pile about 1-2 years ago, he was advised as long as PSA < 10 that it was unlikely to cause problem or be prostate cancer. Currently asymptomatic without BPH symptoms, has occasional nocturia 1 x nightly sometimes, otherwise no significant urinary flow problems during day. No known family history of prostate CA. Due for screening with DRE   Depression screen [facility] 2/9 02/22/2018  Decreased Interest 0  Down, Depressed, Hopeless 0  PHQ - 2  Score 0    Past Medical History:  Diagnosis Date  . Arthritis    Past Surgical History:  Procedure Laterality Date  . COLONOSCOPY WITH PROPOFOL N/A 06/05/2015   Procedure: COLONOSCOPY WITH PROPOFOL;  Surgeon: Kieth Brightly, MD;  Location: [facility] ENDOSCOPY;  Service: Endoscopy;  Laterality: N/A;  . HERNIA REPAIR Left 03/24/2004   inguinal    Social History   Socioeconomic History  . Marital status: Married    Spouse name: Not on file  . Number of children: Not on file  . Years of education: Not on file  . Highest education level: Not on file  Occupational History  . Not on file  Social Needs  . Financial resource strain: Not on file  . Food insecurity:    Worry: Not on file    Inability: Not on file  . Transportation needs:    Medical: Not on file    Non-medical: Not on file  Tobacco Use  . Smoking status: Former Smoker    Last attempt to quit: 02/16/2009    Years since quitting: 9.0  . Smokeless tobacco: Former Engineer, water and Sexual Activity  . Alcohol use: Yes    Alcohol/week: 0.0 standard drinks    Comment: in past  . Drug use: No  . Sexual activity: Not on file  Lifestyle  . Physical activity:    Days per week: Not on file    Minutes per session: Not on file  .  Stress: Not on file  Relationships  . Social connections:    Talks on phone: Not on file    Gets together: Not on file    Attends religious service: Not on file    Active member of club or organization: Not on file    Attends meetings of clubs or organizations: Not on file    Relationship status: Not on file  . Intimate partner violence:    Fear of current or ex partner: Not on file    Emotionally abused: Not on file    Physically abused: Not on file    Forced sexual activity: Not on file  Other Topics Concern  . Not on file  Social History Narrative  . Not on file   Family History  Problem Relation Age of Onset  . Ovarian cancer Mother   . Alcohol abuse Father    No current  outpatient medications on file prior to visit.   No current facility-administered medications on file prior to visit.     Review of Systems Per HPI unless specifically indicated above     Objective:    BP 119/66   Pulse 66   Temp 98.4 F (36.9 C) (Oral)   Resp 16   Ht 5\' 8"  (1.727 m)   Wt 167 lb (75.8 kg)   BMI 25.39 kg/m   Wt Readings from Last 3 Encounters:  02/22/18 167 lb (75.8 kg)  05/12/16 165 lb (74.8 kg)  06/05/15 171 lb (77.6 kg)    Physical Exam Vitals signs and nursing note reviewed.  Constitutional:      General: He is not in acute distress.    Appearance: He is well-developed. He is not diaphoretic.     Comments: Well-appearing thin 78 yr old male, comfortable, cooperative  HENT:     Head: Normocephalic and atraumatic.  Eyes:     General:        Right eye: No discharge.        Left eye: No discharge.     Conjunctiva/sclera: Conjunctivae normal.  Neck:     Thyroid: No thyromegaly.  Cardiovascular:     Rate and Rhythm: Normal rate and regular rhythm.     Heart sounds: Normal heart sounds. No murmur.  Pulmonary:     Effort: Pulmonary effort is normal. No respiratory distress.     Breath sounds: Normal breath sounds. No wheezing or rales.  Musculoskeletal:     Comments: Left hip not fully examined today, has some limited range of motion with changing position seated to standing.  Wearing Left Knee brace today, reduced ROM.  Skin:    General: Skin is warm and dry.     Findings: No erythema or rash.  Neurological:     Mental Status: He is alert and oriented to person, place, and time.  Psychiatric:        Behavior: Behavior normal.     Comments: Well groomed, good eye contact, normal speech and thoughts     I have personally reviewed the radiology report from 04/17/13 Left Hip X-ray.  ** PRIOR REPORT IMPORTED FROM AN EXTERNAL SYSTEM *   CLINICAL DATA:  Left hip pain.   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 No acute osseous erosion or progressed/  significant joint narrowing. The hips are located. No evidence of  acute bony pelvic pathology. Partial ankylosis of  the SI joints,  degenerative given the bony productive change.   IMPRESSION:  Osteonecrosis of the left femoral head with mild subchondral  collapse.    Electronically Signed    By: Tiburcio PeaJonathan  Watts M.D.    On: 04/18/2013 00:07       Assessment & Plan:   Problem List Items Addressed This Visit    Chronic left hip pain   Relevant Medications   mirtazapine (REMERON) 15 MG tablet   Decreased appetite    Uncertain exact etiology, does not seem to be related to mood or depression May be nutritional deficiency if not adhering to regular meals or balanced diet - Weight remains stable in past few years  Plan Previously on Mirtazapine - will re order 15mg  daily by his request today to improve appetite - May add OTC meal supplement such as Boost, failed ensure before      Relevant Medications   mirtazapine (REMERON) 15 MG tablet   omeprazole (PRILOSEC) 20 MG capsule   Elevated PSA, less than 10 ng/ml    Concerning with history of elevated PSA 6-7 from last PCP Dr Maryellen PileEason >1 year ago was advised just to surveillance on PSA, as they did not recommend intervention by patient report - No known fam history of prostate CA  Plan Discussion on options - will request records, also plan to return in 3 months for yearly check up with labs including PSA - will trend result, if still >4 but < 10 will likely still refer to Urology for 2nd opinion given that he does not endorse significant BPH LUTS symptoms - Offered DRE will proceed with this at next visit      Erectile dysfunction    Clinically with multifactorial ED, mostly age related among other Improved on PDE5 inhibitor  Re order Sildenafil 20mg  taking 2 pills per dose with good  results, [facility] as requested [facility]      Relevant Medications   sildenafil (REVATIO) 20 MG tablet   omeprazole (PRILOSEC) 20 MG capsule   Gastroesophageal reflux disease    Clinically with episodic RUQ or upper abdominal pain/discomfort seems related to GERD symptoms, uncontrolled not on any PPI or H2 blocker or antacid therapy - No chronic history of this problem - No significant GI red flag symptoms  Plan - Discussed benefit of balanced diet, regular meals, avoid skipping - Limit trigger foods - Start on PPI trial Omeprazole 20mg  daily PRN before breakfast, may need regular dosing course in future - Consider add carafate PRN - Follow-up if need, consider GI consult      Relevant Medications   omeprazole (PRILOSEC) 20 MG capsule   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HCC)    See A&P for Osteoarthritis - Primary underlying etiology for L hip pain chronic      Primary osteoarthritis involving multiple joints - Primary    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 - Additionally with OA/DJD other joints  Plan - Discussion on OA/DJD progression and therapy - he opts for avoiding surgical intervention, despite previous recommendation to him by ED provider in past - Start with more aggressive OA management with analgesia with regular Tylenol Ext Str dosing 500mg  x 1-2 per dose up to TID often vs PRN - May continue rare NSAID Aleve PRN as well - Continue supportive knee brace and other conservative efforts - Follow-up if not improving, consider other rx therapy such as Gabapentin,  Muscle relaxant, among other pain options, future may need repeat imaging and consider PT vs refer to Ortho for 2nd opinion       Other Visit Diagnoses    Encounter to establish care with new doctor       Epigastric pain          Request records from previous PCP Dr Maryellen Pile  Meds ordered this encounter  Medications  .  sildenafil (REVATIO) 20 MG tablet    Sig: Take 2 pills about 30 min prior to sex, daily as needed, do not repeat dose in one day    Dispense:  100 tablet    Refill:  5  . mirtazapine (REMERON) 15 MG tablet    Sig: Take 1 tablet (15 mg total) by mouth at bedtime.    Dispense:  90 tablet    Refill:  1  . omeprazole (PRILOSEC) 20 MG capsule    Sig: Take 1 capsule (20 mg total) by mouth daily before breakfast. As needed    Dispense:  30 capsule    Refill:  2    Follow up plan: Return in about 3 months (around 05/24/2018) for Annual Physical. vs Yearly Medicare Checkup  Future labs ordered for 05/31/18  He will return for Prevnar-13 pneumonia vaccine for a nurse only visit within 1-2 weeks.  Saralyn Pilar, DO [facility] 02/22/2018, 10:47 AM

## 2018-03-07 ENCOUNTER — Other Ambulatory Visit: Payer: Self-pay | Admitting: Family Medicine

## 2018-03-07 DIAGNOSIS — M150 Primary generalized (osteo)arthritis: Principal | ICD-10-CM

## 2018-03-07 DIAGNOSIS — R7309 Other abnormal glucose: Secondary | ICD-10-CM

## 2018-03-07 DIAGNOSIS — E785 Hyperlipidemia, unspecified: Secondary | ICD-10-CM

## 2018-03-07 DIAGNOSIS — M8949 Other hypertrophic osteoarthropathy, multiple sites: Secondary | ICD-10-CM

## 2018-03-07 DIAGNOSIS — K219 Gastro-esophageal reflux disease without esophagitis: Secondary | ICD-10-CM

## 2018-03-07 DIAGNOSIS — R972 Elevated prostate specific antigen [PSA]: Secondary | ICD-10-CM

## 2018-03-07 DIAGNOSIS — M159 Polyosteoarthritis, unspecified: Secondary | ICD-10-CM

## 2018-04-05 DIAGNOSIS — R030 Elevated blood-pressure reading, without diagnosis of hypertension: Secondary | ICD-10-CM | POA: Diagnosis not present

## 2018-04-05 DIAGNOSIS — R69 Illness, unspecified: Secondary | ICD-10-CM | POA: Diagnosis not present

## 2018-04-05 DIAGNOSIS — M1990 Unspecified osteoarthritis, unspecified site: Secondary | ICD-10-CM | POA: Diagnosis not present

## 2018-04-05 DIAGNOSIS — Z8249 Family history of ischemic heart disease and other diseases of the circulatory system: Secondary | ICD-10-CM | POA: Diagnosis not present

## 2018-04-05 DIAGNOSIS — Z825 Family history of asthma and other chronic lower respiratory diseases: Secondary | ICD-10-CM | POA: Diagnosis not present

## 2018-05-31 ENCOUNTER — Other Ambulatory Visit: Payer: Medicare HMO

## 2018-06-07 ENCOUNTER — Encounter: Payer: Medicare HMO | Admitting: Family Medicine

## 2018-07-14 ENCOUNTER — Other Ambulatory Visit: Payer: Medicare HMO

## 2018-07-21 ENCOUNTER — Encounter: Payer: Medicare HMO | Admitting: Family Medicine

## 2018-08-16 ENCOUNTER — Other Ambulatory Visit: Payer: Self-pay

## 2018-08-16 ENCOUNTER — Other Ambulatory Visit: Payer: Medicare HMO

## 2018-08-16 DIAGNOSIS — R972 Elevated prostate specific antigen [PSA]: Secondary | ICD-10-CM | POA: Diagnosis not present

## 2018-08-16 DIAGNOSIS — M150 Primary generalized (osteo)arthritis: Secondary | ICD-10-CM | POA: Diagnosis not present

## 2018-08-16 DIAGNOSIS — K219 Gastro-esophageal reflux disease without esophagitis: Secondary | ICD-10-CM | POA: Diagnosis not present

## 2018-08-16 DIAGNOSIS — R7309 Other abnormal glucose: Secondary | ICD-10-CM | POA: Diagnosis not present

## 2018-08-16 DIAGNOSIS — E785 Hyperlipidemia, unspecified: Secondary | ICD-10-CM | POA: Diagnosis not present

## 2018-08-17 LAB — COMPLETE METABOLIC PANEL WITH GFR
AG Ratio: 1.3 (calc) (ref 1.0–2.5)
ALT: 11 U/L (ref 9–46)
AST: 15 U/L (ref 10–35)
Albumin: 3.8 g/dL (ref 3.6–5.1)
Alkaline phosphatase (APISO): 43 U/L (ref 35–144)
BUN: 15 mg/dL (ref 7–25)
CO2: 29 mmol/L (ref 20–32)
Calcium: 9.2 mg/dL (ref 8.6–10.3)
Chloride: 104 mmol/L (ref 98–110)
Creat: 0.93 mg/dL (ref 0.70–1.18)
GFR, Est African American: 92 mL/min/{1.73_m2} (ref >=60)
GFR, Est Non African American: 79 mL/min/{1.73_m2} (ref >=60)
Globulin: 2.9 g/dL (calc) (ref 1.9–3.7)
Glucose, Bld: 93 mg/dL (ref 65–99)
Potassium: 4.3 mmol/L (ref 3.5–5.3)
Sodium: 139 mmol/L (ref 135–146)
Total Bilirubin: 0.3 mg/dL (ref 0.2–1.2)
Total Protein: 6.7 g/dL (ref 6.1–8.1)

## 2018-08-17 LAB — PSA: PSA: 6.3 ng/mL — ABNORMAL HIGH (ref <=4.0)

## 2018-08-17 LAB — CBC WITH DIFFERENTIAL/PLATELET
Absolute Monocytes: 357 cells/uL (ref 200–950)
Basophils Absolute: 20 cells/uL (ref 0–200)
Basophils Relative: 0.6 %
Eosinophils Absolute: 78 cells/uL (ref 15–500)
Eosinophils Relative: 2.3 %
HCT: 38.5 % (ref 38.5–50.0)
Hemoglobin: 12.7 g/dL — ABNORMAL LOW (ref 13.2–17.1)
Lymphs Abs: 1275 cells/uL (ref 850–3900)
MCH: 27.6 pg (ref 27.0–33.0)
MCHC: 33 g/dL (ref 32.0–36.0)
MCV: 83.7 fL (ref 80.0–100.0)
MPV: 10.6 fL (ref 7.5–12.5)
Monocytes Relative: 10.5 %
Neutro Abs: 1669 cells/uL (ref 1500–7800)
Neutrophils Relative %: 49.1 %
Platelets: 221 10*3/uL (ref 140–400)
RBC: 4.6 10*6/uL (ref 4.20–5.80)
RDW: 12.5 % (ref 11.0–15.0)
Total Lymphocyte: 37.5 %
WBC: 3.4 10*3/uL — ABNORMAL LOW (ref 3.8–10.8)

## 2018-08-17 LAB — LIPID PANEL
Cholesterol: 169 mg/dL (ref <200)
HDL: 58 mg/dL (ref >=40)
LDL Cholesterol (Calc): 99 mg/dL (calc)
Non-HDL Cholesterol (Calc): 111 mg/dL (calc) (ref <130)
Total CHOL/HDL Ratio: 2.9 (calc) (ref <5.0)
Triglycerides: 42 mg/dL (ref <150)

## 2018-08-17 LAB — HEMOGLOBIN A1C
Hgb A1c MFr Bld: 5.3 % of total Hgb (ref <5.7)
Mean Plasma Glucose: 105 (calc)
eAG (mmol/L): 5.8 (calc)

## 2018-08-24 ENCOUNTER — Encounter: Payer: Medicare HMO | Admitting: Family Medicine

## 2018-08-25 ENCOUNTER — Other Ambulatory Visit: Payer: Self-pay

## 2018-08-25 ENCOUNTER — Encounter: Payer: Self-pay | Admitting: Family Medicine

## 2018-08-25 ENCOUNTER — Ambulatory Visit (INDEPENDENT_AMBULATORY_CARE_PROVIDER_SITE_OTHER): Payer: Medicare HMO | Admitting: Family Medicine

## 2018-08-25 VITALS — BP 123/58 | HR 64 | Temp 98.2°F | Ht 67.5 in | Wt 156.4 lb

## 2018-08-25 DIAGNOSIS — R634 Abnormal weight loss: Secondary | ICD-10-CM | POA: Diagnosis not present

## 2018-08-25 DIAGNOSIS — M150 Primary generalized (osteo)arthritis: Secondary | ICD-10-CM

## 2018-08-25 DIAGNOSIS — R972 Elevated prostate specific antigen [PSA]: Secondary | ICD-10-CM | POA: Diagnosis not present

## 2018-08-25 DIAGNOSIS — M25552 Pain in left hip: Secondary | ICD-10-CM

## 2018-08-25 DIAGNOSIS — K219 Gastro-esophageal reflux disease without esophagitis: Secondary | ICD-10-CM

## 2018-08-25 DIAGNOSIS — G8929 Other chronic pain: Secondary | ICD-10-CM

## 2018-08-25 DIAGNOSIS — M8949 Other hypertrophic osteoarthropathy, multiple sites: Secondary | ICD-10-CM

## 2018-08-25 DIAGNOSIS — M159 Polyosteoarthritis, unspecified: Secondary | ICD-10-CM

## 2018-08-25 DIAGNOSIS — N529 Male erectile dysfunction, unspecified: Secondary | ICD-10-CM | POA: Diagnosis not present

## 2018-08-25 MED ORDER — MEGESTROL ACETATE 40 MG PO TABS: ORAL_TABLET | ORAL | 2 refills | Status: DC

## 2018-08-25 MED ORDER — GABAPENTIN 100 MG PO CAPS: ORAL_CAPSULE | ORAL | 2 refills | Status: DC

## 2018-08-25 MED ORDER — SILDENAFIL CITRATE 20 MG PO TABS: ORAL_TABLET | ORAL | 5 refills | Status: DC

## 2018-08-25 NOTE — Patient Instructions (Addendum)
Thank you for coming to the office today.  Your PSA is 6.3, this is UNCHANGED from 2 years ago. LESS LIKELY for you to have a Prostate Cancer. But we need to watch it closely every 6 months for now.  For herbal supplement for improved urine flow for prostate - can try Saw Palmetto - low dose 80mg  TWICE a day, then increase if needed up to 160mg  twice daily.  For weight gain - start new medicine Megace 40mg  tablets, start with TWO per dose at once with meal, for 80mg , after 1-2 weeks can gradually increase to THREE pills at once, then eventually max is FOUR pills. After 2-3 months, start to taper down gradually, every 1 week, can reduce by 1 pill, so 3 pills a day for 1 week, then 2 pills a day for 1 week, then 1 pill a day for 1 week, then can do 1 pill every other day for 1 week then stop.  Start Gabapentin 100mg  capsules, take at night for 2-3 nights only, and then increase to 2 times a day for a few days, and then may increase to 3 times a day, it may make you drowsy, if helps significantly at night only, then you can increase instead to 3 capsules at night, instead of 3 times a day - In the future if needed, we can significantly increase the dose if tolerated well, some common doses are 300mg  three times a day up to 600mg  three times a day, usually it takes several weeks or months to get to higher doses   Please schedule a Follow-up Appointment to: Return in about 3 months (around 11/25/2018) for 3 months arthritis, weight loss.  If you have any other questions or concerns, please feel free to call the office or send a message through [name]. You may also schedule an earlier appointment if necessary.  Additionally, you may be receiving a survey about your experience at our office within a few days to 1 week by e-mail or mail. We value your feedback.  Nobie Putnam, DO [place]

## 2018-08-25 NOTE — Progress Notes (Signed)
Subjective:    Patient ID: Kevin KapurJames L [name], male    DOB: 01/28/1941, 78 y.o.   MRN: [id number]  Kevin [name] is a 78 y.o. male presenting on 08/25/2018 for Arthritis   HPI  Osteoarthritis multiple joints, Left Hip Chronic problem, multiple joints stiff and sore. Worse L hip, as discussed previously. Takes NSAID PRN. - he is able to function and walk well all day with a Left Knee brace and good support Sketcher's shoes - He needs handicap license plate paperwork today, due to limited ability to walk >200 ft, orthopedic condition with pain - His pain is not improved, he is asking about other pain medication today Admits L hip pain, stiffness reduced ability to ambulate  Weight Loss / Normal Weight BMI >24 / GERD Abdominal Pain - Last visit with me 02/2018, for initial visit for same problem, treated with restarted mirtazapine for appetite and Omeprazole for GERD symptoms, see prior notes for background information. - Interval update with resolved GERD, taking Omeprazole PRN now, not regularly, and his appetite has improved eating well but not gaining weight - Today patient reports request medicine to help gain weight in past was on prednisone with good results on wt gain, now lose some weight despite good appetite Resolved abdominal pain GERD Denies any fever chills sweats nausea vomiting, abdominal pain  Erectile Dysfunction - He has been treated in the past on Sildenafil 20mg  with good results taking 2 pills per dose regularly with good results, needs refill   Health Maintenance:  Prostate CA Screening: Prior PSA reported elevated >6-7 on last check by PCP Dr Maryellen PileEason about 1-2 years ago, he was advised as long as PSA < 10 that it was unlikely to cause problem or be prostate cancer. Currently asymptomatic without BPH symptoms, has occasional nocturia 1 x nightly sometimes, otherwise no significant urinary flow problems during day. No known family history of prostate CA. - Last PSA 6.3  (08/2018) similar to prior result   [facility]/25/2018 02/22/2018  Decreased Interest 0 0  Down, Depressed, Hopeless 0 0  PHQ - 2 Score 0 0    Past Medical History:  Diagnosis Date  . Arthritis    Past Surgical History:  Procedure Laterality Date  . COLONOSCOPY WITH PROPOFOL N/A 06/05/2015   Procedure: COLONOSCOPY WITH PROPOFOL;  Surgeon: Kieth BrightlySeeplaputhur G Sankar, MD;  Location: [facility] ENDOSCOPY;  Service: Endoscopy;  Laterality: N/A;  . HERNIA REPAIR Left 03/24/2004   inguinal    Social History   Socioeconomic History  . Marital status: Married    Spouse name: Not on file  . Number of children: Not on file  . Years of education: Not on file  . Highest education level: Not on file  Occupational History  . Not on file  Social Needs  . Financial resource strain: Not on file  . Food insecurity    Worry: Not on file    Inability: Not on file  . Transportation needs    Medical: Not on file    Non-medical: Not on file  Tobacco Use  . Smoking status: Former Smoker    Quit date: 02/16/2009    Years since quitting: 9.5  . Smokeless tobacco: Never Used  Substance and Sexual Activity  . Alcohol use: Yes    Alcohol/week: 0.0 standard drinks    Comment: rarely   . Drug use: No  . Sexual activity: Not on file  Lifestyle  . Physical activity    Days per  week: Not on file    Minutes per session: Not on file  . Stress: Not on file  Relationships  . Social Herbalist on phone: Not on file    Gets together: Not on file    Attends religious service: Not on file    Active member of club or organization: Not on file    Attends meetings of clubs or organizations: Not on file    Relationship status: Not on file  . Intimate partner violence    Fear of current or ex partner: Not on file    Emotionally abused: Not on file    Physically abused: Not on file    Forced sexual activity: Not on file  Other Topics Concern  . Not on file  Social History Narrative  .  Not on file   Family History  Problem Relation Age of Onset  . Ovarian cancer Mother   . Alcohol abuse Father    Current Outpatient Medications on File Prior to Visit  Medication Sig  . mirtazapine (REMERON) 15 MG tablet Take 1 tablet (15 mg total) by mouth at bedtime.  Marland Kitchen omeprazole (PRILOSEC) 20 MG capsule Take 1 capsule (20 mg total) by mouth daily before breakfast. As needed   No current facility-administered medications on file prior to visit.     Review of Systems Per HPI unless specifically indicated above      Objective:    BP (!) 123/58 (BP Location: Left Arm, Patient Position: Sitting, Cuff Size: Normal)   Pulse 64   Temp 98.2 F (36.8 C) (Oral)   Ht 5' 7.5" (1.715 m)   Wt 156 lb 6.4 oz (70.9 kg)   BMI 24.13 kg/m   Wt Readings from Last 3 Encounters:  08/25/18 156 lb 6.4 oz (70.9 kg)  02/22/18 167 lb (75.8 kg)  05/12/16 165 lb (74.8 kg)    Physical Exam Vitals signs and nursing note reviewed.  Constitutional:      General: He is not in acute distress.    Appearance: He is well-developed. He is not diaphoretic.     Comments: Well-appearing, comfortable, cooperative  HENT:     Head: Normocephalic and atraumatic.  Eyes:     General:        Right eye: No discharge.        Left eye: No discharge.     Conjunctiva/sclera: Conjunctivae normal.  Neck:     Musculoskeletal: Normal range of motion and neck supple.     Thyroid: No thyromegaly.  Cardiovascular:     Rate and Rhythm: Normal rate and regular rhythm.     Heart sounds: Normal heart sounds. No murmur.  Pulmonary:     Effort: Pulmonary effort is normal. No respiratory distress.     Breath sounds: Normal breath sounds. No wheezing or rales.  Musculoskeletal: Normal range of motion.     Comments: Low Back / L hip and Greater Trochanter Inspection: BACK - Normal appearance, no spinal deformity, symmetrical. HIP - Normal appearance, symmetrical, no obvious leg length or pelvis deformity  Palpation:  BACK - No tenderness over spinous processes. Bilateral lumbar paraspinal muscles non-tender and with mild hypertonicity/spasm. HIP - Mild tender to palpation deeper L greater trochanter region of lateral upper thigh. Lower extremity thigh calf soft non tender no spasm.  ROM: BACK - Full active ROM forward flex / back extension, rotation L/R without discomfort HIP - Bilateral hip flex/ext supine slightly reduced Left side, some reduced rotation from seated  Special Testing: BACK - Seated SLR negative for radicular pain bilaterally  Strength: Bilateral hip flex/ext 5/5, knee flex/ext 5/5, ankle dorsiflex/plantarflex 5/5 Neurovascular: intact distal sensation to light touch   Lymphadenopathy:     Cervical: No cervical adenopathy.  Skin:    General: Skin is warm and dry.     Findings: No erythema or rash.  Neurological:     Mental Status: He is alert and oriented to person, place, and time.  Psychiatric:        Behavior: Behavior normal.     Comments: Well groomed, good eye contact, normal speech and thoughts    Results for orders placed or performed in visit on 05/31/18  Lipid panel  Result Value Ref Range   Cholesterol 169 <200 mg/dL   HDL 58 > OR = 40 mg/dL   Triglycerides 42 <161<150 mg/dL   LDL Cholesterol (Calc) 99 mg/dL (calc)   Total CHOL/HDL Ratio 2.9 <5.0 (calc)   Non-HDL Cholesterol (Calc) 111 <130 mg/dL (calc)  COMPLETE METABOLIC PANEL WITH GFR  Result Value Ref Range   Glucose, Bld 93 65 - 99 mg/dL   BUN 15 7 - 25 mg/dL   Creat 0.960.93 0.450.70 - 4.091.18 mg/dL   GFR, Est Non African American 79 > OR = 60 mL/min/1.1073m2   GFR, Est African American 92 > OR = 60 mL/min/1.2173m2   BUN/Creatinine Ratio NOT APPLICABLE 6 - 22 (calc)   Sodium 139 135 - 146 mmol/L   Potassium 4.3 3.5 - 5.3 mmol/L   Chloride 104 98 - 110 mmol/L   CO2 29 20 - 32 mmol/L   Calcium 9.2 8.6 - 10.3 mg/dL   Total Protein 6.7 6.1 - 8.1 g/dL   Albumin 3.8 3.6 - 5.1 g/dL   Globulin 2.9 1.9 - 3.7 g/dL (calc)    AG Ratio 1.3 1.0 - 2.5 (calc)   Total Bilirubin 0.3 0.2 - 1.2 mg/dL   Alkaline phosphatase (APISO) 43 35 - 144 U/L   AST 15 10 - 35 U/L   ALT 11 9 - 46 U/L  CBC with Differential/Platelet  Result Value Ref Range   WBC 3.4 (L) 3.8 - 10.8 Thousand/uL   RBC 4.60 4.20 - 5.80 Million/uL   Hemoglobin 12.7 (L) 13.2 - 17.1 g/dL   HCT 81.138.5 91.438.5 - 78.250.0 %   MCV 83.7 80.0 - 100.0 fL   MCH 27.6 27.0 - 33.0 pg   MCHC 33.0 32.0 - 36.0 g/dL   RDW 95.612.5 21.311.0 - 08.615.0 %   Platelets 221 140 - 400 Thousand/uL   MPV 10.6 7.5 - 12.5 fL   Neutro Abs 1,669 1,500 - 7,800 cells/uL   Lymphs Abs 1,275 850 - 3,900 cells/uL   Absolute Monocytes 357 200 - 950 cells/uL   Eosinophils Absolute 78 15 - 500 cells/uL   Basophils Absolute 20 0 - 200 cells/uL   Neutrophils Relative % 49.1 %   Total Lymphocyte 37.5 %   Monocytes Relative 10.5 %   Eosinophils Relative 2.3 %   Basophils Relative 0.6 %  Hemoglobin A1c  Result Value Ref Range   Hgb A1c MFr Bld 5.3 <5.7 % of total Hgb   Mean Plasma Glucose 105 (calc)   eAG (mmol/L) 5.8 (calc)  PSA  Result Value Ref Range   PSA 6.3 (H) < OR = 4.0 ng/mL      Assessment & Plan:   Problem List Items Addressed This Visit    Chronic left hip pain    See A&P for  arthritis      Relevant Medications   gabapentin (NEURONTIN) 100 MG capsule   Elevated PSA, less than 10 ng/ml - Primary    Stable PSA elevated 6-7 range, last result 6.3 here Concerning with history of elevated PSA 6-7 from last PCP Dr Maryellen Pile >1 year ago was advised just to surveillance on PSA, as they did not recommend intervention by patient report - No known fam history of prostate CA  Plan Reviewed options again - mutual agreement to continue surveillance on PSA lab, next check in 6 months, if still around 6-7 can monitor, I offered Urology referral at anytime if he is ready, it is reasonable to get the consultation. He declines       Erectile dysfunction    Clinically with multifactorial ED, mostly  age related among other Improved on PDE5 inhibitor  Re order Sildenafil  taking 2 pills per dose with good results, rx as requested [facility]      Relevant Medications   sildenafil (REVATIO) 20 MG tablet   Gastroesophageal reflux disease    Improved, episodic GERD - unlikely cause of reduced weight On intermittent PPI - No chronic history of this problem - No significant GI red flag symptoms  Plan - Discussed benefit of balanced diet, regular meals, avoid skipping - Limit trigger foods - Continue PPI PRN - Follow-up if need, consider GI consult      Primary osteoarthritis involving multiple joints    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 - Additionally with OA/DJD other joints  Plan - Discussion on OA/DJD progression and therapy - he opts for avoiding surgical intervention, despite previous recommendation to him by ED provider in past - Start with more aggressive OA management with analgesia with regular Tylenol Ext Str dosing  x 1-2 per dose up to TID often vs PRN - May continue rare NSAID Aleve PRN as well - Continue supportive knee brace and other conservative efforts - Start Gabapentin titration for hip pain arthritis, advised him that I do not recommend hydrocodone at this time - Future muscle relaxant, imaging, PT Ortho  Signed handicap license plate form      Relevant Medications   megestrol (MEGACE) 40 MG tablet   gabapentin (NEURONTIN) 100 MG capsule   Weight loss    Clinically without clear cause. 10 lb wt loss in past 6 months Appetite is good, now on mirtazapine GERD is resolved  Will trial megace rx by patient requested medication, 3 month taper dose, then can remain off med      Relevant Medications   megestrol (MEGACE) 40 MG tablet      Meds ordered this encounter  Medications  . sildenafil (REVATIO) 20 MG tablet    Sig: Take 2 pills about  30 min prior to sex, daily as needed, do not repeat dose in one day    Dispense:  100 tablet    Refill:  5  . megestrol (MEGACE) 40 MG tablet    Sig: Start with 2 tablets a day with meal, then gradually increase as tolerated up to 4 per day. After 2-3 months, taper off as advised    Dispense:  120 tablet    Refill:  2  . gabapentin (NEURONTIN) 100 MG capsule    Sig: Start 1 capsule daily, increase by 1 cap every 2-3 days as tolerated up to 3 times a day, or may take 3  at once in evening.    Dispense:  90 capsule    Refill:  2    Follow up plan: Return in about 3 months (around 11/25/2018) for 3 months arthritis, weight loss.  Future we will re-check PSA in 6 months for another follow-up  Saralyn PilarAlexander Karamalegos, DO [facility] 08/25/2018, 10:31 AM

## 2018-08-25 NOTE — Assessment & Plan Note (Signed)
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 - Additionally with OA/DJD other joints  Plan - Discussion on OA/DJD progression and therapy - he opts for avoiding surgical intervention, despite previous recommendation to him by ED provider in past - Start with more aggressive OA management with analgesia with regular Tylenol Ext Str dosing 500mg  x 1-2 per dose up to TID often vs PRN - May continue rare NSAID Aleve PRN as well - Continue supportive knee brace and other conservative efforts - Start Gabapentin titration for hip pain arthritis, advised him that I do not recommend hydrocodone at this time - Future muscle relaxant, imaging, PT Ortho  Signed handicap license plate form

## 2018-08-25 NOTE — Assessment & Plan Note (Signed)
Stable PSA elevated 6-7 range, last result 6.3 here Concerning with history of elevated PSA 6-7 from last PCP Dr Brynda Greathouse >1 year ago was advised just to surveillance on PSA, as they did not recommend intervention by patient report - No known fam history of prostate CA  Plan Reviewed options again - mutual agreement to continue surveillance on PSA lab, next check in 6 months, if still around 6-7 can monitor, I offered Urology referral at anytime if he is ready, it is reasonable to get the consultation. He declines

## 2018-08-25 NOTE — Assessment & Plan Note (Signed)
Improved, episodic GERD - unlikely cause of reduced weight On intermittent PPI - No chronic history of this problem - No significant GI red flag symptoms  Plan - Discussed benefit of balanced diet, regular meals, avoid skipping - Limit trigger foods - Continue PPI PRN - Follow-up if need, consider GI consult

## 2018-08-25 NOTE — Assessment & Plan Note (Signed)
See A&P for arthritis

## 2018-08-25 NOTE — Assessment & Plan Note (Signed)
Clinically without clear cause. 10 lb wt loss in past 6 months Appetite is good, now on mirtazapine GERD is resolved  Will trial megace rx by patient requested medication, 3 month taper dose, then can remain off med

## 2018-08-25 NOTE — Assessment & Plan Note (Signed)
Clinically with multifactorial ED, mostly age related among other Improved on PDE5 inhibitor  Re order Sildenafil 20mg  taking 2 pills per dose with good results, rx as requested sent to [place]

## 2018-09-28 ENCOUNTER — Ambulatory Visit (INDEPENDENT_AMBULATORY_CARE_PROVIDER_SITE_OTHER): Payer: Medicare HMO | Admitting: Family Medicine

## 2018-09-28 ENCOUNTER — Encounter: Payer: Self-pay | Admitting: Family Medicine

## 2018-09-28 ENCOUNTER — Other Ambulatory Visit: Payer: Self-pay

## 2018-09-28 VITALS — BP 117/65 | HR 73 | Temp 98.6°F | Resp 16 | Ht 67.5 in | Wt 155.4 lb

## 2018-09-28 DIAGNOSIS — R634 Abnormal weight loss: Secondary | ICD-10-CM | POA: Diagnosis not present

## 2018-09-28 DIAGNOSIS — L309 Dermatitis, unspecified: Secondary | ICD-10-CM | POA: Diagnosis not present

## 2018-09-28 DIAGNOSIS — M879 Osteonecrosis, unspecified: Secondary | ICD-10-CM | POA: Diagnosis not present

## 2018-09-28 DIAGNOSIS — G8929 Other chronic pain: Secondary | ICD-10-CM

## 2018-09-28 DIAGNOSIS — R972 Elevated prostate specific antigen [PSA]: Secondary | ICD-10-CM | POA: Diagnosis not present

## 2018-09-28 DIAGNOSIS — M8949 Other hypertrophic osteoarthropathy, multiple sites: Secondary | ICD-10-CM

## 2018-09-28 DIAGNOSIS — M25552 Pain in left hip: Secondary | ICD-10-CM

## 2018-09-28 DIAGNOSIS — M150 Primary generalized (osteo)arthritis: Secondary | ICD-10-CM

## 2018-09-28 DIAGNOSIS — N529 Male erectile dysfunction, unspecified: Secondary | ICD-10-CM

## 2018-09-28 DIAGNOSIS — R69 Illness, unspecified: Secondary | ICD-10-CM | POA: Diagnosis not present

## 2018-09-28 DIAGNOSIS — R6882 Decreased libido: Secondary | ICD-10-CM

## 2018-09-28 DIAGNOSIS — M159 Polyosteoarthritis, unspecified: Secondary | ICD-10-CM

## 2018-09-28 MED ORDER — PREDNISONE 20 MG PO TABS
ORAL_TABLET | ORAL | 0 refills | Status: DC
Start: 2018-09-28 — End: 2018-12-27

## 2018-09-28 MED ORDER — TRIAMCINOLONE ACETONIDE 0.5 % EX CREA: 1.0000 "application " | TOPICAL_CREAM | Freq: Two times a day (BID) | CUTANEOUS | 2 refills | Status: DC

## 2018-09-28 NOTE — Assessment & Plan Note (Signed)
Previously Stable PSA elevated 6-7 range, last result 6.3 here (08/2018) Concerning with history of elevated PSA 6-7 from last PCP Dr Brynda Greathouse >1 year ago was advised just to surveillance on PSA, as they did not recommend intervention by patient report - No known fam history of prostate CA  Plan - Advised that we will re order PSA in 2 months before his October 2020 apt, and if persistent elevated or still has symptoms as described we will refer to Urologist for both PSA monitoring and also for erectile dysfunction, reduced libido, add testosterone lab test as well

## 2018-09-28 NOTE — Assessment & Plan Note (Signed)
See A&P for Osteoarthritis - Primary underlying etiology for L hip pain chronic

## 2018-09-28 NOTE — Progress Notes (Signed)
Subjective:    Patient ID: Kevin [name], male    DOB: 04/04/1940, 78 y.o.   MRN: [id number]  Kevin [name] is a 78 y.o. male presenting on 09/28/2018 for Rash (neck, back on right side and bothside of Kevin [name] toes onset couple of days)   HPI   Eczema Rash Reports several spots on trunk, abdomen, neck and leg with dry irritated skin patches. Kevin [name] used to use eczema cream, does not have it anymore. Previous PCP Dr Maryellen PileEason rx medication for him in past. Kevin [name] has old rx of Bactrim given to him - Kevin [name] is not sure if this is for eczema Kevin [name] said it was when Kevin [name] had an infected spider bite. Admits itching now on rash on abdomen. Not using cream. Denies fever chills spreading redness  Weight Loss / Normal Weight BMI >23 / GERD Abdominal Pain - Last visit with me 08/2018, for same problem, treated with attempted rx megace and already on mirtazapine for appetite and Omeprazole for GERD symptoms, see prior notes for background information. 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 Kevin [name] prior PCP advised him that Kevin [name] did not need to see Urologist. We agreed last time to closely monitor PSA and if continued to raise >7 then we would refer him to Urology for further evaluation. - Now additional complaints Kevin [name] has some mild nocturia, also erectile dysfunction and now reduced libido difficulty climax. Kevin [name] is asking about testosterone and OTC supplements.   Osteoarthritis Multiple Joints, Left Hip Pain Completed Handicap placard form, due to chronic joint pain, wears knee brace, limited ambulation >200 ft without stopping to rest. Due to back and hip pain flare up Kevin [name] is complaining of increased pain and difficulty with ambulation   [facility] 09/28/2018 08/25/2018 02/22/2018  Decreased Interest 0 0 0  Down, Depressed, Hopeless 0 0 0  PHQ - 2 Score 0 0 0    Social History   Tobacco Use  . Smoking status: Former Smoker    Quit date: 02/16/2009    Years since quitting: 9.6  . Smokeless tobacco: Never Used  Substance Use Topics  . Alcohol use: Yes    Alcohol/week: 0.0 standard drinks    Comment: rarely   . Drug use: No    Review of Systems Per HPI unless specifically indicated above     Objective:    BP 117/65   Pulse 73   Temp 98.6 F (37 C) (Oral)   Resp 16   Ht 5' 7.5" (1.715 m)   Wt 155 lb 6.4 oz (70.5 kg)   BMI 23.98 kg/m   Wt Readings from Last 3 Encounters:  09/28/18 155 lb 6.4 oz (70.5 kg)  08/25/18 156 lb 6.4 oz (70.9 kg)  02/22/18 167 lb (75.8 kg)    Physical Exam Vitals signs and nursing note reviewed.  Constitutional:      General: Kevin [name]  is not in acute distress.    Appearance: Kevin [name] is well-developed. Kevin [name] is not diaphoretic.     Comments: Well-appearing, comfortable, cooperative  HENT:     Head: Normocephalic and atraumatic.  Eyes:     General:        Right eye: No discharge.        Left eye: No discharge.     Conjunctiva/sclera: Conjunctivae normal.  Cardiovascular:     Rate and Rhythm: Normal rate.  Pulmonary:     Effort: Pulmonary effort is normal.  Musculoskeletal:     Comments: Right Knee Brace. Limited range of motion with low back and Left Hip pain.  Skin:    General: Skin is warm and dry.     Findings: Rash (scattered patche on R lower abdomen dry flaking mild erythematous patch) present. No erythema.  Neurological:     Mental Status: Kevin [name] is alert and oriented to person, place, and  time.  Psychiatric:        Behavior: Behavior normal.     Comments: Well groomed, good eye contact, normal speech and thoughts    Results for orders placed or performed in visit on 05/31/18  Lipid panel  Result Value Ref Range   Cholesterol 169 <200 mg/dL   HDL 58 > OR = 40 mg/dL   Triglycerides 42 <161<150 mg/dL   LDL Cholesterol (Calc) 99 mg/dL (calc)   Total CHOL/HDL Ratio 2.9 <5.0 (calc)   Non-HDL Cholesterol (Calc) 111 <130 mg/dL (calc)  COMPLETE METABOLIC PANEL WITH GFR  Result Value Ref Range   Glucose, Bld 93 65 - 99 mg/dL   BUN 15 7 - 25 mg/dL   Creat 0.960.93 0.450.70 - 4.091.18 mg/dL   GFR, Est Non African American 79 > OR = 60 mL/min/1.3273m2   GFR, Est African American 92 > OR = 60 mL/min/1.1373m2   BUN/Creatinine Ratio NOT APPLICABLE 6 - 22 (calc)   Sodium 139 135 - 146 mmol/L   Potassium 4.3 3.5 - 5.3 mmol/L   Chloride 104 98 - 110 mmol/L   CO2 29 20 - 32 mmol/L   Calcium 9.2 8.6 - 10.3 mg/dL   Total Protein 6.7 6.1 - 8.1 g/dL   Albumin 3.8 3.6 - 5.1 g/dL   Globulin 2.9 1.9 - 3.7 g/dL (calc)   AG Ratio 1.3 1.0 - 2.5 (calc)   Total Bilirubin 0.3 0.2 - 1.2 mg/dL   Alkaline phosphatase (APISO) 43 35 - 144 U/L   AST 15 10 - 35 U/L   ALT 11 9 - 46 U/L  CBC with Differential/Platelet  Result Value Ref Range   WBC 3.4 (L) 3.8 - 10.8 Thousand/uL   RBC 4.60 4.20 - 5.80 Million/uL   Hemoglobin 12.7 (L) 13.2 - 17.1 g/dL   HCT 81.138.5 91.438.5 - 78.250.0 %   MCV 83.7 80.0 - 100.0 fL   MCH 27.6 27.0 - 33.0 pg   MCHC 33.0 32.0 - 36.0 g/dL   RDW 95.612.5 21.311.0 - 08.615.0 %   Platelets 221 140 - 400 Thousand/uL   MPV 10.6 7.5 - 12.5 fL   Neutro Abs 1,669 1,500 - 7,800 cells/uL   Lymphs Abs 1,275 850 - 3,900 cells/uL   Absolute Monocytes 357 200 - 950 cells/uL   Eosinophils Absolute 78 15 - 500 cells/uL   Basophils Absolute 20 0 - 200 cells/uL   Neutrophils Relative % 49.1 %   Total Lymphocyte 37.5 %   Monocytes Relative 10.5 %   Eosinophils Relative 2.3 %  Basophils Relative 0.6 %  Hemoglobin A1c   Result Value Ref Range   Hgb A1c MFr Bld 5.3 <5.7 % of total Hgb   Mean Plasma Glucose 105 (calc)   eAG (mmol/L) 5.8 (calc)  PSA  Result Value Ref Range   PSA 6.3 (H) < OR = 4.0 ng/mL      Assessment & Plan:   Problem List Items Addressed This Visit    Chronic left hip pain - Primary   Relevant Medications   predniSONE (DELTASONE) 20 MG tablet   Elevated PSA, less than 10 ng/ml    Previously Stable PSA elevated 6-7 range, last result 6.3 here (08/2018) Concerning with history of elevated PSA 6-7 from last PCP Dr Brynda Greathouse >1 year ago was advised just to surveillance on PSA, as they did not recommend intervention by patient report - No known fam history of prostate CA  Plan - Advised that we will re order PSA in 2 months before Kevin [name] October 2020 apt, and if persistent elevated or still has symptoms as described we will refer to Urologist for both PSA monitoring and also for erectile dysfunction, reduced libido, add testosterone lab test as well      Relevant Orders   PSA   Testosterone   Erectile dysfunction   Relevant Orders   Testosterone   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[place])    See A&P for Osteoarthritis - Primary underlying etiology for L hip pain chronic      Relevant Medications   predniSONE (DELTASONE) 20 MG tablet   Primary osteoarthritis involving multiple joints    Acute on chronic flare L hip pain Secondary to OA/DJD w/ imaging showing osteonecrosis in past in 2015 on X-ray - Additionally with OA/DJD other joints  Plan - Discussion on OA/DJD progression and therapy - Kevin [name] opts for avoiding surgical intervention, despite previous recommendation to him by ED provider in past - trial on Prednisone taper today 7 day taper as rx for flare up also can possibly gain some weight as discussed - Continue Tylenol, Gabapentin as rx - May continue rare NSAID Aleve PRN as well - Continue supportive knee brace and other conservative efforts - Future muscle relaxant, imaging, PT Ortho       Relevant Medications   predniSONE (DELTASONE) 20 MG tablet   Weight loss   Relevant Medications   predniSONE (DELTASONE) 20 MG tablet    Other Visit Diagnoses    Eczema, unspecified type       Relevant Medications   triamcinolone cream (KENALOG) 0.5 %   Reduced libido       Relevant Orders   Testosterone    Add triamcinolone cream BID PRN for eczema skin spots, counseling provided   Meds ordered this encounter  Medications  . predniSONE (DELTASONE) 20 MG tablet    Sig: Take daily with food. Start with 60mg  (3 pills) x 2 days, then reduce to 40mg  (2 pills) x 2 days, then 20mg  (1 pill) x 3 days    Dispense:  13 tablet    Refill:  0  . triamcinolone cream (KENALOG) 0.5 %    Sig: Apply 1 application topically 2 (two) times daily. To affected areas, for up to 2 weeks.    Dispense:  30 g    Refill:  2    Follow up plan: Return in about 2 months (around 11/28/2018) for keep apt in october 2020, ADD NON fasting lab only in AM 1 week before.  Future labs ordered for 11/17/2018 PSA  and Testosterone  Saralyn PilarAlexander Maleka Contino, DO [facility] 09/28/2018, 9:26 AM

## 2018-09-28 NOTE — Assessment & Plan Note (Signed)
Acute on chronic flare L hip pain Secondary to OA/DJD w/ imaging showing osteonecrosis in past in 2015 on X-ray - Additionally with OA/DJD other joints  Plan - Discussion on OA/DJD progression and therapy - he opts for avoiding surgical intervention, despite previous recommendation to him by ED provider in past - trial on Prednisone taper today 7 day taper as rx for flare up also can possibly gain some weight as discussed - Continue Tylenol, Gabapentin as rx - May continue rare NSAID Aleve PRN as well - Continue supportive knee brace and other conservative efforts - Future muscle relaxant, imaging, PT Ortho

## 2018-09-28 NOTE — Patient Instructions (Addendum)
Thank you for coming to the office today.  Eczema - start topical cream 1-2 times a day as needed for up to 1-2 weeks, then stop for a while. Then can use again.  Steroid taper over 7 days, prednisone, can help arthritis, eczema flare and also weight gain.  PSA and Testosterone - if needed we can refer you to Urologist to discuss your concerns further.  DUE for NON FASTING BLOOD WORK  SCHEDULE "Lab Only" visit in the morning at the clinic for lab draw in 2 MONTHS   - Make sure Lab Only appointment is at about 1 week before your next appointment, so that results will be available  For Lab Results, once available within 2-3 days of blood draw, you can can log in to MyChart online to view your results and a brief explanation. Also, we can discuss results at next follow-up visit.   Please schedule a Follow-up Appointment to: Return in about 2 months (around 11/28/2018) for keep apt in october 2020, ADD NON fasting lab only in AM 1 week before.  If you have any other questions or concerns, please feel free to call the office or send a message through [place]. You may also schedule an earlier appointment if necessary.  Additionally, you may be receiving a survey about your experience at our office within a few days to 1 week by e-mail or mail. We value your feedback.  Nobie Putnam, DO [place]

## 2018-11-04 ENCOUNTER — Ambulatory Visit (LOCAL_COMMUNITY_HEALTH_CENTER): Payer: Self-pay

## 2018-11-04 ENCOUNTER — Other Ambulatory Visit: Payer: Self-pay

## 2018-11-04 DIAGNOSIS — Z111 Encounter for screening for respiratory tuberculosis: Secondary | ICD-10-CM

## 2018-11-07 ENCOUNTER — Ambulatory Visit (LOCAL_COMMUNITY_HEALTH_CENTER): Payer: Self-pay

## 2018-11-07 ENCOUNTER — Other Ambulatory Visit: Payer: Self-pay

## 2018-11-07 DIAGNOSIS — Z111 Encounter for screening for respiratory tuberculosis: Secondary | ICD-10-CM

## 2018-11-07 LAB — TB SKIN TEST
Induration: 0 mm
TB Skin Test: NEGATIVE

## 2018-11-11 ENCOUNTER — Ambulatory Visit: Payer: Medicare HMO

## 2018-11-13 DIAGNOSIS — R69 Illness, unspecified: Secondary | ICD-10-CM | POA: Diagnosis not present

## 2018-11-14 ENCOUNTER — Ambulatory Visit: Payer: Medicare HMO

## 2018-11-24 ENCOUNTER — Ambulatory Visit: Payer: Medicare HMO | Admitting: Family Medicine

## 2018-12-27 ENCOUNTER — Other Ambulatory Visit: Payer: Self-pay

## 2018-12-27 ENCOUNTER — Encounter: Payer: Self-pay | Admitting: Family Medicine

## 2018-12-27 ENCOUNTER — Ambulatory Visit (INDEPENDENT_AMBULATORY_CARE_PROVIDER_SITE_OTHER): Payer: Medicare HMO | Admitting: Family Medicine

## 2018-12-27 VITALS — BP 134/75 | HR 75 | Temp 98.2°F | Resp 16 | Ht 67.5 in | Wt 153.0 lb

## 2018-12-27 DIAGNOSIS — M7521 Bicipital tendinitis, right shoulder: Secondary | ICD-10-CM

## 2018-12-27 DIAGNOSIS — M79601 Pain in right arm: Secondary | ICD-10-CM

## 2018-12-27 DIAGNOSIS — G8929 Other chronic pain: Secondary | ICD-10-CM

## 2018-12-27 MED ORDER — NAPROXEN 500 MG PO TABS: 500.0000 mg | ORAL_TABLET | Freq: Two times a day (BID) | ORAL | 1 refills | Status: DC

## 2018-12-27 NOTE — Progress Notes (Signed)
Subjective:    Patient ID: Kevin [name], male    DOB: 1940/06/18, 78 y.o.   MRN: [id number]  Kevin [name] is a 78 y.o. male presenting on 12/27/2018 for Arm Pain (right arm x 1 week )   HPI   Right upper extremity pain suspected Tendonitis He has known history of low weight had been on prednisone in past for temporal arteritis for weeks and gained some weight. Recently he had 7 day course for hip pain that did not help as much. Now he complaints of R arm pain. In past naprosyn helped. He does repetitive work / activities, with buffing floor and a lot of pushing and pulling strain on arms. - R Handed - Still admits gradual weight loss Denies any acute injury trauma fall numbness tingling weakness  Depression screen [facility] 2/9 12/27/2018 09/28/2018 08/25/2018  Decreased Interest 0 0 0  Down, Depressed, Hopeless 0 0 0  PHQ - 2 Score 0 0 0  Altered sleeping 0 - -  Tired, decreased energy 0 - -  Change in appetite 0 - -  Feeling bad or failure about yourself  0 - -  Trouble concentrating 0 - -  Moving slowly or fidgety/restless 0 - -  Suicidal thoughts 0 - -  PHQ-9 Score 0 - -    Social History   Tobacco Use  . Smoking status: Former Smoker    Quit date: 02/16/2009    Years since quitting: 9.8  . Smokeless tobacco: Never Used  Substance Use Topics  . Alcohol use: Yes    Alcohol/week: 0.0 standard drinks    Comment: rarely   . Drug use: No    Review of Systems Per HPI unless specifically indicated above     Objective:    BP 134/75 (BP Location: Right Arm, Patient Position: Sitting, Cuff Size: Normal)   Pulse 75   Temp 98.2 F (36.8 C) (Oral)   Resp 16   Ht 5' 7.5" (1.715 m)   Wt 153 lb (69.4 kg)   SpO2 98%   BMI 23.61 kg/m   Wt Readings from Last 3 Encounters:  12/27/18 153 lb (69.4 kg)  09/28/18 155 lb 6.4 oz (70.5 kg)  08/25/18 156 lb 6.4 oz (70.9 kg)    Physical Exam Vitals signs and nursing note reviewed.  Constitutional:      General: He is not in acute  distress.    Appearance: He is well-developed. He is not diaphoretic.     Comments: Well-appearing, comfortable, cooperative, thin   HENT:     Head: Normocephalic and atraumatic.  Eyes:     General:        Right eye: No discharge.        Left eye: No discharge.     Conjunctiva/sclera: Conjunctivae normal.  Cardiovascular:     Rate and Rhythm: Normal rate.  Pulmonary:     Effort: Pulmonary effort is normal.  Musculoskeletal:     Comments: Right upper extremity Localized mild tender to bulk of muscle belly biceps and deltoid, no deformity palpable. Good full AROM shoulder elbow wrist hand Grip distal intact, upper extremity rotator cuff str testing intact, mild impingement symptoms but mostly triggered with biceps  Skin:    General: Skin is warm and dry.     Findings: No erythema or rash.  Neurological:     Mental Status: He is alert and oriented to person, place, and time.  Psychiatric:        Behavior: Behavior  normal.     Comments: Well groomed, good eye contact, normal speech and thoughts    Results for orders placed or performed in visit on 11/04/18  PPD  Result Value Ref Range   TB Skin Test Negative    Induration 0 mm      Assessment & Plan:   Problem List Items Addressed This Visit    None    Visit Diagnoses    Biceps tendonitis on right    -  Primary   Relevant Medications   naproxen (NAPROSYN) 500 MG tablet   Chronic pain of right upper extremity       Relevant Medications   naproxen (NAPROSYN) 500 MG tablet      Clinically suggestive of tendonitis with repetitive strain, likely biceps No other sign of complication, no focal neuro symptoms, not classic for bursitis but has some reduced range of motion and impingement  Will try oral NSAID course Naproxen 500 BID 1-2 weeks then PRN Offer future muscle relaxant, he declines today Tylenol PRN Heating pad, conservative therapy Limit strain on R arm Follow-up sooner if not improving  Meds ordered this  encounter  Medications  . naproxen (NAPROSYN) 500 MG tablet    Sig: Take 1 tablet (500 mg total) by mouth 2 (two) times daily with a meal. For 1-2 weeks then as needed    Dispense:  60 tablet    Refill:  1      Follow up plan: Return in about 3 months (around 03/29/2019) for Weight loss.   Saralyn Pilar, DO [facility] 12/27/2018, 4:13 PM

## 2018-12-27 NOTE — Patient Instructions (Addendum)
Thank you for coming to the office today.  For weight Try 100% Pure Protein - Whey - for weight gain - between meal protein supplement 1-2 times daily, follow instructions.   Recommend trial of Anti-inflammatory with Naproxen (Naprosyn) 500mg  tabs - take one with food and plenty of water TWICE daily every day (breakfast and dinner), for next 1 to 2 weeks, then you may take only as needed - DO NOT TAKE any ibuprofen, aleve, motrin while you are taking this medicine  - It is safe to take Tylenol Ext Str 500mg  tabs - take 1 to 2 (max dose 1000mg ) every 6 hours as needed for breakthrough pain, max 24 hour daily dose is 6 to 8 tablets or 4000mg   Can do a muscle rub as needed  Try to limit the strain on your upper arm / limit buffing with pushing pulling activity   Please schedule a Follow-up Appointment to: Return in about 3 months (around 03/29/2019) for Weight loss.  If you have any other questions or concerns, please feel free to call the office or send a message through [place]. You may also schedule an earlier appointment if necessary.  Additionally, you may be receiving a survey about your experience at our office within a few days to 1 week by e-mail or mail. We value your feedback.  Nobie Putnam, DO [facility], [facility]   Distal Biceps Tendinitis Rehab Ask your health care provider which exercises are safe for you. Do exercises exactly as told by your health care provider and adjust them as directed. It is normal to feel mild stretching, pulling, tightness, or discomfort as you do these exercises. Stop right away if you feel sudden pain or your pain gets worse. Do not begin these exercises until told by your health care provider. Stretching and range-of-motion exercises These exercises warm up your muscles and joints and improve the movement and flexibility of your arm. These exercises can also help to relieve pain and stiffness. Elbow range of motion 1. Stand  or sit with your left / right elbow bent in a 90-degree angle (right angle). Position your forearm so that the thumb is facing the ceiling (neutral position). 2. Slowly straighten your elbow until you feel a stretch. 3. Hold this position for __________ seconds. 4. Slowly bend your elbow until you feel a stretch, or until you touch your thumb to your shoulder. 5. Hold this position for __________ seconds. Repeat __________ times. Complete this exercise __________ times a day. Forearm rotation, supination 1. Stand up or sit with your left / right elbow bent in a 90-degree angle (right angle). 2. Rotate your palm up until you cannot rotate it anymore (supination). Then, use your other hand to help turn your left / right forearm more. 3. Hold this position for __________ seconds. 4. Slowly return to the starting position. Repeat __________ times. Complete this exercise __________ times a day. Forearm rotation, pronation 1. Stand up or sit with your left / right elbow bent in a 90-degree angle (right angle). 2. Turn (rotate) your left / right palm down (pronation) until you cannot rotate it anymore. Then, use your other hand to help turn your left / right forearm more. 3. Hold this position for __________ seconds. 4. Slowly return to the starting position. Repeat __________ times. Complete this exercise __________ times a day. Biceps stretch 1. Stand by a door frame. 2. Place your left / right hand on the door frame. Keep your elbow straight during the  exercise. Gently turn your body toward the opposite side until you feel a gentle stretch. 3. Hold this position for __________ seconds. 4. Slowly return to the starting position. Repeat __________ times. Complete this exercise __________ times a day. Strengthening exercises These exercises build strength and endurance in your arm and shoulder. Endurance is the ability to use your muscles for a long time, even after they get tired. Forearm  rotation, supination  1. Sit with your left / right forearm supported on a table. Your elbow should be at waist height. 2. Gently grasp a lightweight hammer near the head. As this exercise gets easier for you, try holding the hammer farther down the handle. 3. Rest your hand over the edge of the table, palm down. 4. Without moving your left / right elbow, slowly rotate your palm up (supination), stopping when your thumb is pointed toward the ceiling. 5. Hold this position for__________ seconds. 6. Slowly return to the starting position. Repeat __________ times. Complete this exercise __________ times a day. Forearm rotation, pronation  1. Sit with your left / right forearm supported on a table. Your elbow should be at waist height. 2. Gently grasp a lightweight hammer near the head. As this exercise gets easier for you, try holding the hammer farther down the handle. 3. Rest your hand over the edge of the table, palm up. 4. Without moving your left / right elbow, slowly rotate your palm down (pronation), stopping when your thumb is pointed toward the floor. 5. Hold this position for __________ seconds. 6. Slowly return to the starting position. Repeat __________ times. Complete this exercise __________ times a day. Biceps curls  1. Sit on a stable chair without armrests, or stand up. 2. If directed, hold a __________ weight in your left / right hand, or hold an exercise band with both hands. Your palms should face up toward the ceiling at the starting position. 3. Bend your left / right elbow and move your hand up toward your shoulder. Keep your other arm straight down, in the starting position. 4. Slowly return to the starting position. Repeat __________ times. Complete this exercise __________ times a day. Elbow extension, supine  1. Lie on your back (supine position). 2. Hold a __________ weight in your left / right hand. 3. Bend your left / right elbow to a 90-degree angle (right  angle) so the weight is in front of your face, over your chest, and your elbow is pointed up to the ceiling. 4. Straighten your elbow, raising your hand toward the ceiling (extension). Use your other hand to support your left / right upper arm and to keep it still. 5. Slowly return to the starting position. Repeat __________ times. Complete this exercise __________ times a day. This information is not intended to replace advice given to you by your health care provider. Make sure you discuss any questions you have with your health care provider. Document Released: 02/02/2005 Document Revised: 05/31/2018 Document Reviewed: 02/03/2018 Elsevier Patient Education  2020 [employer].

## 2018-12-28 NOTE — Addendum Note (Signed)
Addended by: Olin Hauser on: 12/28/2018 07:13 AM   Modules accepted: Level of Service

## 2019-01-03 ENCOUNTER — Ambulatory Visit: Payer: Medicare HMO | Admitting: Family Medicine

## 2019-03-09 ENCOUNTER — Encounter: Payer: Self-pay | Admitting: Family Medicine

## 2019-03-09 ENCOUNTER — Ambulatory Visit (INDEPENDENT_AMBULATORY_CARE_PROVIDER_SITE_OTHER): Payer: Medicare HMO | Admitting: Family Medicine

## 2019-03-09 ENCOUNTER — Other Ambulatory Visit: Payer: Self-pay

## 2019-03-09 VITALS — BP 116/65 | HR 69 | Temp 97.3°F | Resp 16 | Ht 67.5 in | Wt 152.0 lb

## 2019-03-09 DIAGNOSIS — M8949 Other hypertrophic osteoarthropathy, multiple sites: Secondary | ICD-10-CM

## 2019-03-09 DIAGNOSIS — M25552 Pain in left hip: Secondary | ICD-10-CM | POA: Diagnosis not present

## 2019-03-09 DIAGNOSIS — R634 Abnormal weight loss: Secondary | ICD-10-CM | POA: Diagnosis not present

## 2019-03-09 DIAGNOSIS — M879 Osteonecrosis, unspecified: Secondary | ICD-10-CM

## 2019-03-09 DIAGNOSIS — G8929 Other chronic pain: Secondary | ICD-10-CM | POA: Diagnosis not present

## 2019-03-09 DIAGNOSIS — M159 Polyosteoarthritis, unspecified: Secondary | ICD-10-CM

## 2019-03-09 NOTE — Patient Instructions (Addendum)
Thank you for coming to the office today.  Left hip joint bursa injection NEXT TIME.  Please schedule a Follow-up Appointment to: Return in about 5 days (around 03/14/2019) for Follow-up next Tuesday for Left hip steroid injection.  If you have any other questions or concerns, please feel free to call the office or send a message through MyChart. You may also schedule an earlier appointment if necessary.  Additionally, you may be receiving a survey about your experience at our office within a few days to 1 week by e-mail or mail. We value your feedback.  Saralyn Pilar, DO [facility], [place]

## 2019-03-09 NOTE — Progress Notes (Signed)
Subjective:    Patient ID: Kevin [name], male    DOB: September 12, 1940, 79 y.o.   MRN: [id number]  Kevin [name] is a 79 y.o. 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 Prior imaging osteonecrosis L hip in 2015. He has long term issues with OA / DJD multiple joints especially L hip for years. Affects his mobility and causes limp at times. He has pain flare up worse with increased activity and ambulation. He uses handicap placard Takes NSAID PRN naproxen, Gabapentin. He has not seen Orthopedics for this. Not interested today He is requesting L hip steroid injection to be scheduled. Has not had injection before Denies new injury fall trauma, focal weakness numbness tingling  Weight Loss / Normal Weight BMI >23 Chronic issue for him, see prior notes. Has been treated for GERD mixed results. Some weight gain on steroids in past. Weight down 1 lb in 3 months He still improves diet with protein supplement. Denies any fever chills sweats nausea vomiting, abdominal pain   Depression screen [facility] 2/9 03/09/2019 12/27/2018 09/28/2018  Decreased Interest 0 0 0  Down, Depressed, Hopeless 0 0 0  PHQ - 2 Score 0 0 0  Altered sleeping - 0 -  Tired, decreased energy - 0 -  Change in appetite - 0 -  Feeling bad or failure about yourself  - 0 -  Trouble concentrating - 0 -  Moving slowly or fidgety/restless - 0 -  Suicidal thoughts - 0 -  PHQ-9 Score - 0 -    Social History   Tobacco Use  . Smoking status: Former Smoker    Quit date: 02/16/2009    Years since quitting: 10.0  . Smokeless tobacco: Never Used  Substance Use Topics  . Alcohol use: Yes    Alcohol/week: 0.0 standard drinks    Comment: rarely   . Drug use: No    Review of Systems Per HPI unless specifically indicated above     Objective:    BP 116/65   Pulse 69   Temp (!) 97.3 F (36.3 C) (Oral)    Resp 16   Ht 5' 7.5" (1.715 m)   Wt 152 lb (68.9 kg)   BMI 23.46 kg/m   Wt Readings from Last 3 Encounters:  03/09/19 152 lb (68.9 kg)  12/27/18 153 lb (69.4 kg)  09/28/18 155 lb 6.4 oz (70.5 kg)    Physical Exam Vitals and nursing note reviewed.  Constitutional:      General: He is not in acute distress.    Appearance: He is well-developed. He is not diaphoretic.     Comments: Well-appearing, comfortable, cooperative  HENT:     Head: Normocephalic and atraumatic.  Eyes:     General:        Right eye: No discharge.        Left eye: No discharge.     Conjunctiva/sclera: Conjunctivae normal.  Cardiovascular:     Rate and Rhythm: Normal rate.  Pulmonary:     Effort: Pulmonary effort is normal.  Musculoskeletal:     Comments: Low Back / L hip and Greater Trochanter Inspection: BACK - Normal appearance, no spinal deformity, symmetrical. HIP - Normal appearance, symmetrical, no obvious leg length or pelvis deformity  Palpation: BACK - No tenderness over spinous processes. Bilateral lumbar paraspinal muscles non-tender and without hypertonicity/spasm HIP - Mild tender to  palpation deeper L greater trochanter region of lateral upper thigh. Lower extremity thigh calf soft non tender no spasm.  ROM: BACK - Full active ROM forward flex / back extension, rotation L/R without discomfort HIP - Left hip reduced internal rotation with discomfort, some limited ext rotation. Has intact flex/ext  Strength: Bilateral hip flex/ext 5/5, knee flex/ext 5/5, ankle dorsiflex/plantarflex 5/5 Neurovascular: intact distal sensation to light touch   Skin:    General: Skin is warm and dry.     Findings: No erythema or rash.  Neurological:     Mental Status: He is alert and oriented to person, place, and time.  Psychiatric:        Behavior: Behavior normal.     Comments: Well groomed, good eye contact, normal speech and thoughts       LEFT HIP X-ray from 2015  * PRIOR REPORT IMPORTED FROM  AN EXTERNAL SYSTEM *   CLINICAL DATA:  Left hip pain.   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 No acute osseous erosion or progressed/  significant joint narrowing. The hips are located. No evidence of  acute bony pelvic pathology. Partial ankylosis of the SI joints,  degenerative given the bony productive change.   IMPRESSION:  Osteonecrosis of the left femoral head with mild subchondral  collapse.    Electronically Signed    By: Tiburcio Pea M.D.    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 Known osteoarthritis multiple joints No new injury  Plan: 1. Advised / offered troch bursa steroid injection - he declined today but wants to re-schedule injection for next week on Tuesday, we discussed that my primary recommendation is for orthopedic referral to pursue acetabular hip joint injection as this may benefit his osteonecrosis most - but he declines for now, we can reconsider in future, will trial troch bursa injection first and see how he does, then anticipate referral to orthopedics if result is not optimal - Continue current meds NSAID Gabapentin May use Tylenol PRN for breakthrough Encouraged use of heating pad 1-2x daily for now then PRN.   #Weight loss Stable, down 1 lb  in 3 months, improved on protein supplement Monitor for now, re-discuss GERD in future if affecting his appetite  No orders of the defined types were placed in this encounter.     Follow up plan: Return in about 5 days (around 03/14/2019) for Follow-up next Tuesday for Left hip steroid injection.   Saralyn Pilar, DO [facility] 03/09/2019, 8:41 AM

## 2019-03-14 ENCOUNTER — Other Ambulatory Visit: Payer: Self-pay

## 2019-03-14 ENCOUNTER — Encounter: Payer: Self-pay | Admitting: Family Medicine

## 2019-03-14 ENCOUNTER — Ambulatory Visit (INDEPENDENT_AMBULATORY_CARE_PROVIDER_SITE_OTHER): Payer: Medicare HMO | Admitting: Family Medicine

## 2019-03-14 VITALS — BP 121/72 | HR 62 | Temp 97.7°F | Resp 16 | Ht 67.5 in | Wt 155.0 lb

## 2019-03-14 DIAGNOSIS — G8929 Other chronic pain: Secondary | ICD-10-CM | POA: Diagnosis not present

## 2019-03-14 DIAGNOSIS — M879 Osteonecrosis, unspecified: Secondary | ICD-10-CM | POA: Diagnosis not present

## 2019-03-14 DIAGNOSIS — M25552 Pain in left hip: Secondary | ICD-10-CM | POA: Diagnosis not present

## 2019-03-14 MED ORDER — LIDOCAINE HCL (PF) 1 % IJ SOLN
4.0000 mL | Freq: Once | INTRAMUSCULAR | Status: AC
  Administered 2019-03-14: 09:00:00 4 mL

## 2019-03-14 MED ORDER — METHYLPREDNISOLONE ACETATE 40 MG/ML IJ SUSP
40.0000 mg | Freq: Once | INTRAMUSCULAR | Status: AC
  Administered 2019-03-14: 40 mg via INTRA_ARTICULAR

## 2019-03-14 NOTE — Patient Instructions (Addendum)
Thank you for coming to the office today.  You received a Left Hip Joint steroid injection today. - Lidocaine numbing medicine may ease the pain initially for a few hours until it wears off - As discussed, you may experience a "steroid flare" this evening or within 24-48 hours, anytime medicine is injected into an inflamed joint it can cause the pain to get worse temporarily - Everyone responds differently to these injections, it depends on the patient and the severity of the joint problem, it may provide anywhere from days to weeks, to months of relief. Ideal response is >6 months relief - Try to take it easy for next 1-2 days, avoid over activity and strain on joint (limit walking for knee or hip) - Recommend the following:   - For swelling - rest, compression sleeve / ACE wrap, elevation, and ice packs as needed for first few days   - For pain in future may use heating pad or moist heat as needed  If not improving we can refer to Orthopedic.  Please schedule a Follow-up Appointment to: Return in about 3 months (around 06/12/2019), or if symptoms worsen or fail to improve, for hip arthritis pain.  If you have any other questions or concerns, please feel free to call the office or send a message through [facility]. You may also schedule an earlier appointment if necessary.  Additionally, you may be receiving a survey about your experience at our office within a few days to 1 week by e-mail or mail. We value your feedback.  Nobie Putnam, DO [facility], [facility]   Hip Exercises Ask your health care provider which exercises are safe for you. Do exercises exactly as told by your health care provider and adjust them as directed. It is normal to feel mild stretching, pulling, tightness, or discomfort as you do these exercises. Stop right away if you feel sudden pain or your pain gets worse. Do not begin these exercises until told by your health care provider. Stretching and  range-of-motion exercises These exercises warm up your muscles and joints and improve the movement and flexibility of your hip. These exercises also help to relieve pain, numbness, and tingling. You may be asked to limit your range of motion if you had a hip replacement. Talk to your health care provider about these restrictions. Hamstrings, supine  1. Lie on your back (supine position). 2. Loop a belt or towel over the ball of your left / right foot. The ball of your foot is on the walking surface, right under your toes. 3. Straighten your left / right knee and slowly pull on the belt or towel to raise your leg until you feel a gentle stretch behind your knee (hamstring). ? Do not let your knee bend while you do this. ? Keep your other leg flat on the floor. 4. Hold this position for __________ seconds. 5. Slowly return your leg to the starting position. Repeat __________ times. Complete this exercise __________ times a day. Hip rotation  1. Lie on your back on a firm surface. 2. With your left / right hand, gently pull your left / right knee toward the shoulder that is on the same side of the body. Stop when your knee is pointing toward the ceiling. 3. Hold your left / right ankle with your other hand. 4. Keeping your knee steady, gently pull your left / right ankle toward your other shoulder until you feel a stretch in your buttocks. ? Keep your  hips and shoulders firmly planted while you do this stretch. 5. Hold this position for __________ seconds. Repeat __________ times. Complete this exercise __________ times a day. Seated stretch This exercise is sometimes called hamstrings and adductors stretch. 1. Sit on the floor with your legs stretched wide. Keep your knees straight during this exercise. 2. Keeping your head and back in a straight line, bend at your waist to reach for your left foot (position A). You should feel a stretch in your right inner thigh (adductors). 3. Hold this  position for __________ seconds. Then slowly return to the upright position. 4. Keeping your head and back in a straight line, bend at your waist to reach forward (position B). You should feel a stretch behind both of your thighs and knees (hamstrings). 5. Hold this position for __________ seconds. Then slowly return to the upright position. 6. Keeping your head and back in a straight line, bend at your waist to reach for your right foot (position C). You should feel a stretch in your left inner thigh (adductors). 7. Hold this position for __________ seconds. Then slowly return to the upright position. Repeat __________ times. Complete this exercise __________ times a day. Lunge This exercise stretches the muscles of the hip (hip flexors). 1. Place your left / right knee on the floor and bend your other knee so that is directly over your ankle. You should be half-kneeling. 2. Keep good posture with your head over your shoulders. 3. Tighten your buttocks to point your tailbone downward. This will prevent your back from arching too much. 4. You should feel a gentle stretch in the front of your left / right thigh and hip. If you do not feel a stretch, slide your other foot forward slightly and then slowly lunge forward with your chest up until your knee once again lines up over your ankle. ? Make sure your tailbone continues to point downward. 5. Hold this position for __________ seconds. 6. Slowly return to the starting position. Repeat __________ times. Complete this exercise __________ times a day. Strengthening exercises These exercises build strength and endurance in your hip. Endurance is the ability to use your muscles for a long time, even after they get tired. Bridge This exercise strengthens the muscles of your hip (hip extensors). 1. Lie on your back on a firm surface with your knees bent and your feet flat on the floor. 2. Tighten your buttocks muscles and lift your bottom off the floor  until the trunk of your body and your hips are level with your thighs. ? Do not arch your back. ? You should feel the muscles working in your buttocks and the back of your thighs. If you do not feel these muscles, slide your feet 1-2 inches (2.5-5 cm) farther away from your buttocks. 3. Hold this position for __________ seconds. 4. Slowly lower your hips to the starting position. 5. Let your muscles relax completely between repetitions. Repeat __________ times. Complete this exercise __________ times a day. Straight leg raises, side-lying This exercise strengthens the muscles that move the hip joint away from the center of the body (hip abductors). 1. Lie on your side with your left / right leg in the top position. Lie so your head, shoulder, hip, and knee line up. You may bend your bottom knee slightly to help you balance. 2. Roll your hips slightly forward, so your hips are stacked directly over each other and your left / right knee is facing forward. 3. Leading  with your heel, lift your top leg 4-6 inches (10-15 cm). You should feel the muscles in your top hip lifting. ? Do not let your foot drift forward. ? Do not let your knee roll toward the ceiling. 4. Hold this position for __________ seconds. 5. Slowly return to the starting position. 6. Let your muscles relax completely between repetitions. Repeat __________ times. Complete this exercise __________ times a day. Straight leg raises, side-lying This exercise strengthens the muscles that move the hip joint toward the center of the body (hip adductors). 1. Lie on your side with your left / right leg in the bottom position. Lie so your head, shoulder, hip, and knee line up. You may place your upper foot in front to help you balance. 2. Roll your hips slightly forward, so your hips are stacked directly over each other and your left / right knee is facing forward. 3. Tense the muscles in your inner thigh and lift your bottom leg 4-6 inches  (10-15 cm). 4. Hold this position for __________ seconds. 5. Slowly return to the starting position. 6. Let your muscles relax completely between repetitions. Repeat __________ times. Complete this exercise __________ times a day. Straight leg raises, supine This exercise strengthens the muscles in the front of your thigh (quadriceps). 1. Lie on your back (supine position) with your left / right leg extended and your other knee bent. 2. Tense the muscles in the front of your left / right thigh. You should see your kneecap slide up or see increased dimpling just above your knee. 3. Keep these muscles tight as you raise your leg 4-6 inches (10-15 cm) off the floor. Do not let your knee bend. 4. Hold this position for __________ seconds. 5. Keep these muscles tense as you lower your leg. 6. Relax the muscles slowly and completely between repetitions. Repeat __________ times. Complete this exercise __________ times a day. Hip abductors, standing This exercise strengthens the muscles that move the leg and hip joint away from the center of the body (hip abductors). 1. Tie one end of a rubber exercise band or tubing to a secure surface, such as a chair, table, or pole. 2. Loop the other end of the band or tubing around your left / right ankle. 3. Keeping your ankle with the band or tubing directly opposite the secured end, step away until there is tension in the tubing or band. Hold on to a chair, table, or pole as needed for balance. 4. Lift your left / right leg out to your side. While you do this: ? Keep your back upright. ? Keep your shoulders over your hips. ? Keep your toes pointing forward. ? Make sure to use your hip muscles to slowly lift your leg. Do not tip your body or forcefully lift your leg. 5. Hold this position for __________ seconds. 6. Slowly return to the starting position. Repeat __________ times. Complete this exercise __________ times a day. Squats This exercise  strengthens the muscles in the front of your thigh (quadriceps). 1. Stand in a door frame so your feet and knees are in line with the frame. You may place your hands on the frame for balance. 2. Slowly bend your knees and lower your hips like you are going to sit in a chair. ? Keep your lower legs in a straight-up-and-down position. ? Do not let your hips go lower than your knees. ? Do not bend your knees lower than told by your health care provider. ? If  your hip pain increases, do not bend as low. 3. Hold this position for ___________ seconds. 4. Slowly push with your legs to return to standing. Do not use your hands to pull yourself to standing. Repeat __________ times. Complete this exercise __________ times a day. This information is not intended to replace advice given to you by your health care provider. Make sure you discuss any questions you have with your health care provider. Document Revised: 09/08/2018 Document Reviewed: 12/14/2017 Elsevier Patient Education  2020 [employer].

## 2019-03-14 NOTE — Progress Notes (Signed)
Subjective:    Patient ID: Kevin [name], male    DOB: [birth year], 79 y.o.   MRN: [id number]  Kevin [name] is a 79 y.o. male presenting on 03/14/2019 for Hip Pain   HPI   Chronic Left Hip Pain / history of Osteonecrosis L Hip Osteoarthritis Multiple Joints Known chronic problems. Prior imaging osteonecrosis L hip in 2015. He has long term issues with OA / DJD multiple joints especially L hip for years. Affects his mobility and causes limp at times. He has pain flare up worse with increased activity and ambulation. He uses handicap placard Takes NSAID PRN naproxen, Gabapentin.  Last visit 03/09/19 recently to discuss same problem. He was offered L hip trochanteric bursa injection but he declined and wanted to reschedule, now here back today for this injection. Describes pain is worse with prolonged walking and repetitive activity, radiates more inward to groin and down leg at times.  He has not seen Orthopedics for this yet. He is requesting L hip steroid injection today. Has not had injection before Denies new injury fall trauma, focal weakness numbness tingling   Depression screen [facility] 2/9 03/09/2019 12/27/2018 09/28/2018  Decreased Interest 0 0 0  Down, Depressed, Hopeless 0 0 0  PHQ - 2 Score 0 0 0  Altered sleeping - 0 -  Tired, decreased energy - 0 -  Change in appetite - 0 -  Feeling bad or failure about yourself  - 0 -  Trouble concentrating - 0 -  Moving slowly or fidgety/restless - 0 -  Suicidal thoughts - 0 -  PHQ-9 Score - 0 -    Social History   Tobacco Use  . Smoking status: Former Smoker    Quit date: 02/16/2009    Years since quitting: 10.0  . Smokeless tobacco: Never Used  Substance Use Topics  . Alcohol use: Yes    Alcohol/week: 0.0 standard drinks    Comment: rarely   . Drug use: No    Review of Systems Per HPI unless specifically indicated above     Objective:    BP 121/72   Pulse 62   Temp 97.7 F (36.5 C) (Oral)   Resp 16   Ht 5' 7.5"  (1.715 m)   Wt 155 lb (70.3 kg)   BMI 23.92 kg/m   Wt Readings from Last 3 Encounters:  03/14/19 155 lb (70.3 kg)  03/09/19 152 lb (68.9 kg)  12/27/18 153 lb (69.4 kg)    Physical Exam Vitals and nursing note reviewed.  Constitutional:      General: He is not in acute distress.    Appearance: He is well-developed. He is not diaphoretic.     Comments: Well-appearing, comfortable, cooperative  HENT:     Head: Normocephalic and atraumatic.  Eyes:     General:        Right eye: No discharge.        Left eye: No discharge.     Conjunctiva/sclera: Conjunctivae normal.  Cardiovascular:     Rate and Rhythm: Normal rate.  Pulmonary:     Effort: Pulmonary effort is normal.  Musculoskeletal:     Comments: Low Back / L hip and Greater Trochanter Inspection: BACK - Normal appearance, no spinal deformity, symmetrical. HIP - Normal appearance, symmetrical, no obvious leg length or pelvis deformity  Palpation: BACK - No tenderness over spinous processes. Bilateral lumbar paraspinal muscles non-tender and without hypertonicity/spasm HIP - Mild tender to palpation deeper L greater trochanter region of lateral  upper thigh. Lower extremity thigh calf soft non tender no spasm.  ROM: BACK - Full active ROM forward flex / back extension, rotation L/R without discomfort HIP - Left hip reduced internal rotation with discomfort, some limited ext rotation. Has intact flex/ext  Strength: Bilateral hip flex/ext 5/5, knee flex/ext 5/5, ankle dorsiflex/plantarflex 5/5 Neurovascular: intact distal sensation to light touch   Skin:    General: Skin is warm and dry.     Findings: No erythema or rash.  Neurological:     Mental Status: He is alert and oriented to person, place, and time.  Psychiatric:        Behavior: Behavior normal.     Comments: Well groomed, good eye contact, normal speech and thoughts      ________________________________________________________ PROCEDURE NOTE Date:  03/14/19 Left Hip cortisone injection Discussed benefits and risks (including pain, bleeding, infection, steroid flare). Verbal consent given by patient. Medication:  1 cc Depo-medrol 40mg  and 4 cc Lidocaine 1% without epi Time Out taken  Patient in lateral decubitus position with affected LEFT hip superior. Landmarks identified over  greater trochanter. Palpated to identify point of maximum tenderness over bony process. Area cleansed with alcohol wipes. Using 21 gauage and 1, 1/2 inch needle, Left trochanteric bursa space was injected (with above listed medication) with needle to bone contact then slightly withdrawn to inject. cold spray used for superficial anesthetic. Sterile bandage placed. Patient tolerated procedure well without bleeding or paresthesias. No complications.       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 Known osteoarthritis multiple joints No new injury  Plan: 1. Proceed with L Hip Trochanteric Bursa cortisone injection today, see procedure note and AVS aftercare  - Additionally advised if limited results < 6 wk to 3 months, we can reconsider referral to Orthopedic for acetabular hip joint space injection if indicated given his underlying pathology.  - Continue current meds NSAID Gabapentin May use Tylenol PRN for breakthrough Encouraged use of heating pad 1-2x daily for now then PRN. Handout hip exercises   Meds ordered this encounter  Medications  . lidocaine (PF) (XYLOCAINE) 1 % injection 4 mL  . methylPREDNISolone acetate (DEPO-MEDROL) injection 40 mg      Follow up plan: Return in about 3 months (around 06/12/2019), or if symptoms worsen or fail to  improve, for hip arthritis pain.   Nobie Putnam, [facility] 03/14/2019, 8:34 AM

## 2019-03-24 ENCOUNTER — Ambulatory Visit: Payer: Medicare HMO | Attending: Internal Medicine

## 2019-03-24 DIAGNOSIS — Z20822 Contact with and (suspected) exposure to covid-19: Secondary | ICD-10-CM

## 2019-03-25 LAB — NOVEL CORONAVIRUS, NAA: SARS-CoV-2, NAA: NOT DETECTED

## 2019-04-11 ENCOUNTER — Ambulatory Visit: Payer: Medicare HMO

## 2019-07-11 ENCOUNTER — Ambulatory Visit: Payer: Medicare HMO

## 2019-08-31 DIAGNOSIS — Z8249 Family history of ischemic heart disease and other diseases of the circulatory system: Secondary | ICD-10-CM | POA: Diagnosis not present

## 2019-08-31 DIAGNOSIS — M1990 Unspecified osteoarthritis, unspecified site: Secondary | ICD-10-CM | POA: Diagnosis not present

## 2019-08-31 DIAGNOSIS — H269 Unspecified cataract: Secondary | ICD-10-CM | POA: Diagnosis not present

## 2019-08-31 DIAGNOSIS — G8929 Other chronic pain: Secondary | ICD-10-CM | POA: Diagnosis not present

## 2019-08-31 DIAGNOSIS — Z791 Long term (current) use of non-steroidal anti-inflammatories (NSAID): Secondary | ICD-10-CM | POA: Diagnosis not present

## 2019-09-08 ENCOUNTER — Other Ambulatory Visit: Payer: Self-pay | Admitting: Family Medicine

## 2019-09-08 DIAGNOSIS — G8929 Other chronic pain: Secondary | ICD-10-CM

## 2019-09-08 DIAGNOSIS — N529 Male erectile dysfunction, unspecified: Secondary | ICD-10-CM

## 2019-09-08 DIAGNOSIS — M159 Polyosteoarthritis, unspecified: Secondary | ICD-10-CM

## 2019-09-08 DIAGNOSIS — M150 Primary generalized (osteo)arthritis: Secondary | ICD-10-CM

## 2019-09-08 DIAGNOSIS — K219 Gastro-esophageal reflux disease without esophagitis: Secondary | ICD-10-CM

## 2019-09-08 DIAGNOSIS — M8949 Other hypertrophic osteoarthropathy, multiple sites: Secondary | ICD-10-CM

## 2019-09-08 DIAGNOSIS — R630 Anorexia: Secondary | ICD-10-CM

## 2019-09-19 ENCOUNTER — Other Ambulatory Visit: Payer: Self-pay

## 2019-09-19 ENCOUNTER — Ambulatory Visit (INDEPENDENT_AMBULATORY_CARE_PROVIDER_SITE_OTHER): Payer: Medicare HMO | Admitting: Family Medicine

## 2019-09-19 ENCOUNTER — Ambulatory Visit: Payer: Medicare HMO | Admitting: Family Medicine

## 2019-09-19 ENCOUNTER — Encounter: Payer: Self-pay | Admitting: Family Medicine

## 2019-09-19 VITALS — BP 117/70 | HR 72 | Temp 97.5°F | Resp 16 | Ht 67.5 in | Wt 145.6 lb

## 2019-09-19 DIAGNOSIS — M533 Sacrococcygeal disorders, not elsewhere classified: Secondary | ICD-10-CM | POA: Diagnosis not present

## 2019-09-19 DIAGNOSIS — R972 Elevated prostate specific antigen [PSA]: Secondary | ICD-10-CM

## 2019-09-19 DIAGNOSIS — Z1159 Encounter for screening for other viral diseases: Secondary | ICD-10-CM | POA: Diagnosis not present

## 2019-09-19 DIAGNOSIS — W19XXXA Unspecified fall, initial encounter: Secondary | ICD-10-CM

## 2019-09-19 DIAGNOSIS — R634 Abnormal weight loss: Secondary | ICD-10-CM

## 2019-09-19 DIAGNOSIS — R69 Illness, unspecified: Secondary | ICD-10-CM | POA: Diagnosis not present

## 2019-09-19 DIAGNOSIS — R7309 Other abnormal glucose: Secondary | ICD-10-CM

## 2019-09-19 DIAGNOSIS — Z114 Encounter for screening for human immunodeficiency virus [HIV]: Secondary | ICD-10-CM

## 2019-09-19 NOTE — Progress Notes (Signed)
Subjective:    Patient ID: Kevin [name], male    DOB: 12-02-1940, 79 y.o.   MRN: [id number]  Kevin [name] is a 78 y.o. male presenting on 09/19/2019 for Fall (lower back soreness onset 5 days)   HPI   Acute Pain Sacrum Low Back / Acute Fall Injury Osteoarthritis Multiple Joints Reports fall from seated position on concrete, missed the chair he was attempting to sit in Has naproxen, gabapentin, not always taking Pain is not severe only sore to touch often. He can sit and ambulate and function.  Chronic Unintentional Weight Loss < 10% over 6 months. Down 10-11 lbs Review prior charts He has had appetite reduced in past now that is resolved. No GERD. On mirtazapine but not gaining weight. He is still losing. He asks about dental issue causing this, he has dentist apt Prior lab elevated PSA 6-7 in 07/2018, he is due for repeat lab, he had declined urology before, prior PCP trended the PSA and it has been similar before  Denies, fever chills night sweats, nausea vomiting, early satiety, dark stool or blood in stool  Depression screen [facility] 2/9 03/09/2019 12/27/2018 09/28/2018  Decreased Interest 0 0 0  Down, Depressed, Hopeless 0 0 0  PHQ - 2 Score 0 0 0  Altered sleeping - 0 -  Tired, decreased energy - 0 -  Change in appetite - 0 -  Feeling bad or failure about yourself  - 0 -  Trouble concentrating - 0 -  Moving slowly or fidgety/restless - 0 -  Suicidal thoughts - 0 -  PHQ-9 Score - 0 -    Social History   Tobacco Use  . Smoking status: Former Smoker    Quit date: 02/16/2009    Years since quitting: 10.5  . Smokeless tobacco: Never Used  Vaping Use  . Vaping Use: Never used  Substance Use Topics  . Alcohol use: Yes    Alcohol/week: 0.0 standard drinks    Comment: rarely   . Drug use: No    Review of Systems Per HPI unless specifically indicated above     Objective:    BP 117/70   Pulse 72   Temp (!) 97.5 F (36.4 C) (Temporal)   Resp 16   Ht 5' 7.5" (1.715  m)   Wt 145 lb 9.6 oz (66 kg)   SpO2 100%   BMI 22.47 kg/m   Wt Readings from Last 3 Encounters:  09/19/19 145 lb 9.6 oz (66 kg)  03/14/19 155 lb (70.3 kg)  03/09/19 152 lb (68.9 kg)    Physical Exam Vitals and nursing note reviewed.  Constitutional:      General: He is not in acute distress.    Appearance: He is well-developed. He is not diaphoretic.     Comments: Well-appearing, comfortable, thin body habitus, cooperative  HENT:     Head: Normocephalic and atraumatic.  Eyes:     General:        Right eye: No discharge.        Left eye: No discharge.     Conjunctiva/sclera: Conjunctivae normal.  Neck:     Thyroid: No thyromegaly.  Cardiovascular:     Rate and Rhythm: Normal rate and regular rhythm.     Heart sounds: Normal heart sounds. No murmur heard.   Pulmonary:     Effort: Pulmonary effort is normal. No respiratory distress.     Breath sounds: Normal breath sounds. No wheezing or rales.  Musculoskeletal:  General: Normal range of motion.     Cervical back: Normal range of motion and neck supple.     Comments: Localized tender to palpation over coccyx tailbone and sacral area. Able to ambulate, with chronic gait problem with left hip chronic problem. Able to sit without significant pain, stand from seated.  Lymphadenopathy:     Cervical: No cervical adenopathy.  Skin:    General: Skin is warm and dry.     Findings: No erythema or rash.  Neurological:     Mental Status: He is alert and oriented to person, place, and time.  Psychiatric:        Behavior: Behavior normal.     Comments: Well groomed, good eye contact, normal speech and thoughts    Results for orders placed or performed in visit on 03/24/19  Novel Coronavirus, NAA ([facility])   Specimen: Oropharyngeal(OP) collection in vial transport medium   OROPHARYNGEA  TESTING  Result Value Ref Range   SARS-CoV-2, NAA Not Detected Not Detected      Assessment & Plan:   Problem List Items Addressed This  Visit    Weight loss    Other Visit Diagnoses    Coccyx pain    -  Primary   SI (sacroiliac) pain       Fall, initial encounter          #Coccyx, Sacroiliac Pain / fall New injury 5 days ago, seems improved some now today He is able to sit and function, has soreness mostly and point tenderness over coccyx. Will defer X-ray today, he does request x-ray when he returns for other imaging Continue current conservative medication management. X-rays on file for next week 8/10  #Weight Loss, unintentional < 10% in 6 months. See weight trend above and in chart. Overall down about 10-11 lbs in 6 months overall Had improved temporarily on prednisone steroids for other cause (joint pain arthritis) Attempted trial on megace, but not covered by insurance Had treated GERD no longer reducing appetite Improved appetite on Mirtazapine, PO intake has improved over past 3-6 months but still not able to retain or gain weight.  Already had Colonoscopy on 06/05/15 by Dr Evette Cristal, no specimens polyps collected. Good report. Unlikely to have developed colon CA within 4 years.  No recent imaging otherwise, no other additional symptoms targeting the source of this problem now.  Will return within 1 week for broad work up now that it has been persistent for >6 months.  Will check lab work and Chest X-ray as part of algorithm, in future if indicated can pursue CT abdomen/pelvis as other option.  Note he has had prior elevated PSA 6-7 range back in 07/2018, he declined Urology referral, had previously been elevated from other PCP in past, he was asked to return sooner instead.  No orders of the defined types were placed in this encounter.     Follow up plan: Return in about 1 week (around 09/26/2019) for Lab/Imaging only - 1 week next Tuesday 8/10 at 8am for non fasting lab + X-rays.  Future labs ordered for next week Tues 8/10 8am, FULL LAB PANEL ALGORTHIM, Chest-X-ray and Sacrum X-ray then consider future  CT abdomen if not improve   Saralyn Pilar, DO [facility] 09/19/2019, 4:34 PM

## 2019-09-19 NOTE — Patient Instructions (Addendum)
Thank you for coming to the office today.  For sacrum or tailbone pain, likely contusion or bruised  Keep on current medicine  May sit donut or cushion to alleviate pressure.  X-ray is WALK IN no apt needed, after blood, for Chest X-ray and Sacrum or tailbone  DUE for  NON FASTING BLOOD WORK  SCHEDULE "Lab Only" visit in the morning at the clinic for lab draw in 1  WEEK  - Make sure Lab Only appointment is at about 1 week before your next appointment, so that results will be available  For Lab Results, once available within 2-3 days of blood draw, you can can log in to MyChart online to view your results and a brief explanation. Also, we can discuss results at next follow-up visit.   Please schedule a Follow-up Appointment to: Return in about 1 week (around 09/26/2019) for Lab/Imaging only - 1 week next Tuesday 8/10 at 8am for non fasting lab + X-rays.  If you have any other questions or concerns, please feel free to call the office or send a message through MyChart. You may also schedule an earlier appointment if necessary.  Additionally, you may be receiving a survey about your experience at our office within a few days to 1 week by e-mail or mail. We value your feedback.  Saralyn Pilar, DO [facility], [place]

## 2019-09-21 ENCOUNTER — Other Ambulatory Visit: Payer: Self-pay | Admitting: Family Medicine

## 2019-09-21 DIAGNOSIS — R634 Abnormal weight loss: Secondary | ICD-10-CM

## 2019-09-21 DIAGNOSIS — R7309 Other abnormal glucose: Secondary | ICD-10-CM

## 2019-09-21 DIAGNOSIS — R972 Elevated prostate specific antigen [PSA]: Secondary | ICD-10-CM

## 2019-09-21 DIAGNOSIS — Z114 Encounter for screening for human immunodeficiency virus [HIV]: Secondary | ICD-10-CM

## 2019-09-21 DIAGNOSIS — Z1159 Encounter for screening for other viral diseases: Secondary | ICD-10-CM

## 2019-09-26 ENCOUNTER — Other Ambulatory Visit: Payer: Medicare HMO

## 2019-09-26 ENCOUNTER — Other Ambulatory Visit: Payer: Self-pay

## 2019-09-26 ENCOUNTER — Ambulatory Visit
Admission: RE | Admit: 2019-09-26 | Discharge: 2019-09-26 | Disposition: A | Discharge status: Home / Self Care | Payer: Medicare HMO | Attending: Family Medicine | Admitting: Family Medicine

## 2019-09-26 ENCOUNTER — Ambulatory Visit
Admission: RE | Admit: 2019-09-26 | Discharge: 2019-09-26 | Disposition: A | Discharge status: Home / Self Care | Payer: Medicare HMO | Source: Ambulatory Visit | Attending: Family Medicine | Admitting: Family Medicine

## 2019-09-26 DIAGNOSIS — M533 Sacrococcygeal disorders, not elsewhere classified: Secondary | ICD-10-CM | POA: Insufficient documentation

## 2019-09-26 DIAGNOSIS — R634 Abnormal weight loss: Secondary | ICD-10-CM | POA: Insufficient documentation

## 2019-09-26 DIAGNOSIS — R972 Elevated prostate specific antigen [PSA]: Secondary | ICD-10-CM | POA: Diagnosis not present

## 2019-09-26 DIAGNOSIS — Z1159 Encounter for screening for other viral diseases: Secondary | ICD-10-CM | POA: Diagnosis not present

## 2019-09-26 DIAGNOSIS — R69 Illness, unspecified: Secondary | ICD-10-CM | POA: Diagnosis not present

## 2019-09-26 DIAGNOSIS — Z114 Encounter for screening for human immunodeficiency virus [HIV]: Secondary | ICD-10-CM | POA: Diagnosis not present

## 2019-09-26 DIAGNOSIS — R7309 Other abnormal glucose: Secondary | ICD-10-CM | POA: Diagnosis not present

## 2019-09-26 DIAGNOSIS — S3992XA Unspecified injury of lower back, initial encounter: Secondary | ICD-10-CM | POA: Diagnosis not present

## 2019-09-29 ENCOUNTER — Other Ambulatory Visit: Payer: Self-pay

## 2019-09-29 ENCOUNTER — Ambulatory Visit (LOCAL_COMMUNITY_HEALTH_CENTER): Payer: Self-pay

## 2019-09-29 DIAGNOSIS — Z111 Encounter for screening for respiratory tuberculosis: Secondary | ICD-10-CM

## 2019-09-29 LAB — CBC WITH DIFFERENTIAL/PLATELET
Absolute Monocytes: 427 cells/uL (ref 200–950)
Basophils Absolute: 42 cells/uL (ref 0–200)
Basophils Relative: 1.2 %
Eosinophils Absolute: 49 cells/uL (ref 15–500)
Eosinophils Relative: 1.4 %
HCT: 42.5 % (ref 38.5–50.0)
Hemoglobin: 13.1 g/dL — ABNORMAL LOW (ref 13.2–17.1)
Lymphs Abs: 1299 cells/uL (ref 850–3900)
MCH: 27.3 pg (ref 27.0–33.0)
MCHC: 30.8 g/dL — ABNORMAL LOW (ref 32.0–36.0)
MCV: 88.7 fL (ref 80.0–100.0)
MPV: 11.1 fL (ref 7.5–12.5)
Monocytes Relative: 12.2 %
Neutro Abs: 1684 cells/uL (ref 1500–7800)
Neutrophils Relative %: 48.1 %
Platelets: 192 10*3/uL (ref 140–400)
RBC: 4.79 10*6/uL (ref 4.20–5.80)
RDW: 12.2 % (ref 11.0–15.0)
Total Lymphocyte: 37.1 %
WBC: 3.5 10*3/uL — ABNORMAL LOW (ref 3.8–10.8)

## 2019-09-29 LAB — COMPLETE METABOLIC PANEL WITH GFR
AG Ratio: 1.4 (calc) (ref 1.0–2.5)
ALT: 11 U/L (ref 9–46)
AST: 18 U/L (ref 10–35)
Albumin: 4.3 g/dL (ref 3.6–5.1)
Alkaline phosphatase (APISO): 42 U/L (ref 35–144)
BUN: 16 mg/dL (ref 7–25)
CO2: 26 mmol/L (ref 20–32)
Calcium: 9.5 mg/dL (ref 8.6–10.3)
Chloride: 103 mmol/L (ref 98–110)
Creat: 0.99 mg/dL (ref 0.70–1.18)
GFR, Est African American: 85 mL/min/{1.73_m2} (ref >=60)
GFR, Est Non African American: 73 mL/min/{1.73_m2} (ref >=60)
Globulin: 3.1 g/dL (calc) (ref 1.9–3.7)
Glucose, Bld: 87 mg/dL (ref 65–99)
Potassium: 4.4 mmol/L (ref 3.5–5.3)
Sodium: 140 mmol/L (ref 135–146)
Total Bilirubin: 0.5 mg/dL (ref 0.2–1.2)
Total Protein: 7.4 g/dL (ref 6.1–8.1)

## 2019-09-29 LAB — T4, FREE: Free T4: 1.2 ng/dL (ref 0.8–1.8)

## 2019-09-29 LAB — PSA: PSA: 5.7 ng/mL — ABNORMAL HIGH (ref <=4.0)

## 2019-09-29 LAB — HCV RNA,QUANTITATIVE REAL TIME PCR
HCV Quantitative Log: <1.18 Log IU/mL
HCV RNA, PCR, QN: <15 IU/mL

## 2019-09-29 LAB — C-REACTIVE PROTEIN: CRP: 0.6 mg/L (ref <8.0)

## 2019-09-29 LAB — HEPATITIS C ANTIBODY
Hepatitis C Ab: REACTIVE — AB
SIGNAL TO CUT-OFF: 1.06 — ABNORMAL HIGH (ref <1.00)

## 2019-09-29 LAB — TSH: TSH: 2.9 mIU/L (ref 0.40–4.50)

## 2019-09-29 LAB — HIV ANTIBODY (ROUTINE TESTING W REFLEX): HIV 1&2 Ab, 4th Generation: NONREACTIVE

## 2019-09-29 LAB — SEDIMENTATION RATE: Sed Rate: 6 mm/h (ref 0–20)

## 2019-09-29 LAB — HEMOGLOBIN A1C
Hgb A1c MFr Bld: 5.4 % of total Hgb (ref <5.7)
Mean Plasma Glucose: 108 (calc)
eAG (mmol/L): 6 (calc)

## 2019-10-02 ENCOUNTER — Other Ambulatory Visit: Payer: Self-pay

## 2019-10-02 ENCOUNTER — Ambulatory Visit (LOCAL_COMMUNITY_HEALTH_CENTER): Payer: Medicare HMO

## 2019-10-02 DIAGNOSIS — Z111 Encounter for screening for respiratory tuberculosis: Secondary | ICD-10-CM

## 2019-10-02 LAB — TB SKIN TEST
Induration: 0 mm
TB Skin Test: NEGATIVE

## 2019-10-04 ENCOUNTER — Other Ambulatory Visit: Payer: Self-pay | Admitting: Family Medicine

## 2019-10-04 DIAGNOSIS — R634 Abnormal weight loss: Secondary | ICD-10-CM

## 2019-10-04 DIAGNOSIS — Z8619 Personal history of other infectious and parasitic diseases: Secondary | ICD-10-CM

## 2019-10-04 NOTE — Telephone Encounter (Signed)
Copied from CRM [id number]. Topic: General - Other >> Oct 03, 2019  3:39 PM Lyn Hollingshead D wrote: PT will not share specific reason for the call / need a callback from DR K / please advise

## 2019-10-04 NOTE — Telephone Encounter (Signed)
Unable to reach the patient send my chart message.

## 2019-10-10 ENCOUNTER — Telehealth: Payer: Self-pay | Admitting: Family Medicine

## 2019-10-10 ENCOUNTER — Other Ambulatory Visit: Payer: Self-pay | Admitting: Family Medicine

## 2019-10-10 DIAGNOSIS — N529 Male erectile dysfunction, unspecified: Secondary | ICD-10-CM

## 2019-10-10 NOTE — Telephone Encounter (Signed)
Medication request has been sent from the pharmacy; pending.

## 2019-10-10 NOTE — Telephone Encounter (Signed)
Copied from CRM [id number]. Topic: Quick Communication - Rx Refill/Question >> Oct 10, 2019  4:04 PM Jaquita Rector A wrote: Medication: sildenafil (REVATIO) 20 MG tablet   Has the patient contacted their pharmacy? Yes.   (Agent: If no, request that the patient contact the pharmacy for the refill.) (Agent: If yes, when and what did the pharmacy advise?)  Preferred Pharmacy (with phone number or street name): [facility] [place], [place] - [address]  Phone:  [phone] Fax:  [phone]     Agent: Please be advised that RX refills may take up to 3 business days. We ask that you follow-up with your pharmacy.

## 2019-10-10 NOTE — Telephone Encounter (Signed)
Requested Prescriptions  Pending Prescriptions Disp Refills   sildenafil (REVATIO) 20 MG tablet [Pharmacy Med Name: SILDENAFIL CITRATE 20 MG TAB] 100 tablet 1    Sig: TAKE 2 TABLETS BY MOUTH 30 MINUTES PRIORTO SEX DAILY AS NEEDED. 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

## 2019-10-17 ENCOUNTER — Encounter: Payer: Self-pay | Admitting: *Deleted

## 2019-12-12 DIAGNOSIS — H52223 Regular astigmatism, bilateral: Secondary | ICD-10-CM | POA: Diagnosis not present

## 2019-12-26 DIAGNOSIS — R69 Illness, unspecified: Secondary | ICD-10-CM | POA: Diagnosis not present

## 2019-12-27 DIAGNOSIS — H40023 Open angle with borderline findings, high risk, bilateral: Secondary | ICD-10-CM | POA: Diagnosis not present

## 2020-01-02 ENCOUNTER — Ambulatory Visit: Payer: Medicare HMO | Admitting: Family Medicine

## 2020-01-03 ENCOUNTER — Other Ambulatory Visit: Payer: Self-pay

## 2020-01-03 ENCOUNTER — Telehealth: Payer: Self-pay

## 2020-01-03 ENCOUNTER — Ambulatory Visit: Payer: Medicare HMO | Admitting: Family Medicine

## 2020-01-03 NOTE — Telephone Encounter (Signed)
Copied from CRM [id number]. Topic: General - Inquiry >> Jan 03, 2020  1:21 PM Adrian Prince D wrote: Reason for CRM: Patient called and asked that Dr. Charm Rings nurse to give him a call. He can be reached at [phone]. Please advise

## 2020-01-03 NOTE — Telephone Encounter (Signed)
Unable to reach patient left him message stating he needs to clarify with CRM when he calls back --he left same way message in 09/2019 but then does not reply phone message back -- I think he might needs medication refill ?

## 2020-01-03 NOTE — Telephone Encounter (Signed)
Patient was scheduled for yesterday and then today and has cancelled.  Maybe he was calling regarding these apt?  Saralyn Pilar, DO [facility] 01/03/2020, 4:39 PM

## 2020-01-10 ENCOUNTER — Ambulatory Visit: Payer: Self-pay | Admitting: *Deleted

## 2020-01-10 NOTE — Telephone Encounter (Signed)
I attempted to return his call regarding abd pain.   Left a message for him to call us back.

## 2020-01-10 NOTE — Telephone Encounter (Signed)
I returned his call.    A note was left for a nurse to call him back about abd pain.   When I called him and started the triage he stated he already talked with someone and got an appt on 11/29 for this issue.  So I confirmed he wasn't having any other issue or pain that he needed to talk with me about.   He responded,  "No".   "I think it's a hernia below my navel".   "I've had 2 hernia operations and this is just like my hernias before".   "I can push it back in and it's soft".     So I instructed him to go to the ED between now and his appt if he experiences sharp pain, redness or is unable to push the hernia back in.   He verbalized understanding.  Reason for Disposition . [1] MILD-MODERATE pain AND [2] constant and [3] present < 2 hours    See progress note.   He already has an appt on 01/15/2020 for this issue.  Possible hernia which he has had 2 surgeries for in the past.  He did not need a call from me.  Answer Assessment - Initial Assessment Questions 1. LOCATION: "Where does it hurt?"      I think I have a hernia around and below my navel.   When I wake up in the morning I feel the pain under my breast bone and then it goes away after I take the pill he gave me.   I take before breakfast.  I've had a couple of hernia operations.  I have an area above my pubic area that pushes out but I can push it back in. 2. RADIATION: "Does the pain shoot anywhere else?" (e.g., chest, back)     *No Answer* 3. ONSET: "When did the pain begin?" (Minutes, hours or days ago)      *No Answer* 4. SUDDEN: "Gradual or sudden onset?"     *No Answer* 5. PATTERN "Does the pain come and go, or is it constant?"    - If constant: "Is it getting better, staying the same, or worsening?"      (Note: Constant means the pain never goes away completely; most serious pain is constant and it progresses)     - If intermittent: "How long does it last?" "Do you have pain now?"     (Note: Intermittent means the pain goes away  completely between bouts)     *No Answer* 6. SEVERITY: "How bad is the pain?"  (e.g., Scale 1-10; mild, moderate, or severe)    - MILD (1-3): doesn't interfere with normal activities, abdomen soft and not tender to touch     - MODERATE (4-7): interferes with normal activities or awakens from sleep, tender to touch     - SEVERE (8-10): excruciating pain, doubled over, unable to do any normal activities       *No Answer* 7. RECURRENT SYMPTOM: "Have you ever had this type of stomach pain before?" If Yes, ask: "When was the last time?" and "What happened that time?"      *No Answer* 8. CAUSE: "What do you think is causing the stomach pain?"     *No Answer* 9. RELIEVING/AGGRAVATING FACTORS: "What makes it better or worse?" (e.g., movement, antacids, bowel movement)     *No Answer* 10. OTHER SYMPTOMS: "Has there been any vomiting, diarrhea, constipation, or urine problems?"       *No Answer*  Protocols  used: ABDOMINAL PAIN - MALE-A-AH

## 2020-01-15 ENCOUNTER — Ambulatory Visit: Payer: Medicare HMO | Admitting: Family Medicine

## 2020-01-15 ENCOUNTER — Telehealth: Payer: Self-pay

## 2020-01-15 DIAGNOSIS — R7989 Other specified abnormal findings of blood chemistry: Secondary | ICD-10-CM

## 2020-01-15 DIAGNOSIS — N529 Male erectile dysfunction, unspecified: Secondary | ICD-10-CM

## 2020-01-15 DIAGNOSIS — R972 Elevated prostate specific antigen [PSA]: Secondary | ICD-10-CM

## 2020-01-15 NOTE — Telephone Encounter (Signed)
Patient is asking med's for testosterone don't see any listed in his chart [facility] --don't know if he needs appointment ?

## 2020-01-15 NOTE — Telephone Encounter (Signed)
Copied from CRM [id number]. Topic: General - Other >> Jan 15, 2020  3:57 PM Wyonia Hough E wrote: Reason for CRM: Pt would like to speak with Dr. Althea Charon Pt did not want to disclose reason / please advise

## 2020-01-15 NOTE — Telephone Encounter (Signed)
Called patient.  Referral to urology placed. See below on details for reason for referral. 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 Additionally he has elevated PSA 5-6 range, and has had chronic weight loss.   Has the referral been discussed with the patient?: yes   Designated contact for the referral if not the patient (name/phone number): patient on chart   Has the patient seen a specialist for this issue before?: No  If so, who (practice/provider)?   Does the patient have a provider or location preference for the referral?: Yes - [facility] [place] - Request early morning apt - or after 2pm only if possible.   Would the patient like to see previous specialist if applicable?   Saralyn Pilar, DO [facility] 01/15/2020, 5:16 PM

## 2020-01-26 ENCOUNTER — Ambulatory Visit: Payer: Medicare HMO | Admitting: Family Medicine

## 2020-02-01 ENCOUNTER — Encounter: Payer: Self-pay | Admitting: Gastroenterology

## 2020-02-01 ENCOUNTER — Ambulatory Visit: Payer: Medicare HMO | Admitting: Gastroenterology

## 2020-02-14 ENCOUNTER — Encounter: Payer: Self-pay | Admitting: Family Medicine

## 2020-02-14 ENCOUNTER — Encounter: Payer: Self-pay | Admitting: Urology

## 2020-02-14 ENCOUNTER — Ambulatory Visit (INDEPENDENT_AMBULATORY_CARE_PROVIDER_SITE_OTHER): Payer: Medicare HMO | Admitting: Surgery

## 2020-02-14 ENCOUNTER — Ambulatory Visit: Payer: Medicare HMO | Admitting: Urology

## 2020-02-14 ENCOUNTER — Other Ambulatory Visit: Payer: Self-pay

## 2020-02-14 ENCOUNTER — Ambulatory Visit: Payer: Self-pay | Admitting: *Deleted

## 2020-02-14 ENCOUNTER — Ambulatory Visit (INDEPENDENT_AMBULATORY_CARE_PROVIDER_SITE_OTHER): Payer: Medicare HMO | Admitting: Family Medicine

## 2020-02-14 ENCOUNTER — Encounter: Payer: Self-pay | Admitting: Surgery

## 2020-02-14 VITALS — BP 159/100 | HR 56 | Temp 98.0°F | Ht 68.0 in | Wt 151.4 lb

## 2020-02-14 VITALS — BP 159/84 | HR 55 | Temp 97.3°F | Resp 16 | Ht 68.0 in | Wt 151.0 lb

## 2020-02-14 DIAGNOSIS — K4091 Unilateral inguinal hernia, without obstruction or gangrene, recurrent: Secondary | ICD-10-CM

## 2020-02-14 DIAGNOSIS — K4090 Unilateral inguinal hernia, without obstruction or gangrene, not specified as recurrent: Secondary | ICD-10-CM | POA: Diagnosis not present

## 2020-02-14 NOTE — Patient Instructions (Addendum)
Dr.Piscoya was able to reduce the bulging from the Hernia at today's visit. Dr.Piscoya discussed the surgical treatment, risk factors and recovery phase of surgery with patient at today's visit. Patient advised to avoid any heavy lifting, bending, pushing or pulling of 10-15 pounds or more. Patient advised to avoid constipation to prevent pressure to the Hernia (groin area). Our surgery scheduler Britta Mccreedy will contact you within the next 24-48 hours to discuss the preparation prior to surgery and also discuss the times to align with your schedule. Please have the BLUE sheet when she contacts you. If you have any questions or concerns, please do not hesitate to give our office a call.  Inguinal Hernia, Adult An inguinal hernia is when fat or your intestines push through a weak spot in a muscle where your leg meets your lower belly (groin). This causes a rounded lump (bulge). This kind of hernia could also be:  In your scrotum, if you are male.  In folds of skin around your vagina, if you are male. There are three types of inguinal hernias. These include:  Hernias that can be pushed back into the belly (are reducible). This type rarely causes pain.  Hernias that cannot be pushed back into the belly (are incarcerated).  Hernias that cannot be pushed back into the belly and lose their blood supply (are strangulated). This type needs emergency surgery. If you do not have symptoms, you may not need treatment. If you have symptoms or a large hernia, you may need surgery. Follow these instructions at home: Lifestyle  Do these things if told by your doctor so you do not have trouble pooping (constipation): ? Drink enough fluid to keep your pee (urine) pale yellow. ? Eat foods that have a lot of fiber. These include fresh fruits and vegetables, whole grains, and beans. ? Limit foods that are high in fat and processed sugars. These include foods that are fried or sweet. ? Take medicine for trouble  pooping.  Avoid lifting heavy objects.  Avoid standing for long amounts of time.  Do not use any products that contain nicotine or tobacco. These include cigarettes and e-cigarettes. If you need help quitting, ask your doctor.  Stay at a healthy weight. General instructions  You may try to push your hernia in by very gently pressing on it when you are lying down. Do not try to force the bulge back in if it will not push in easily.  Watch your hernia for any changes in shape, size, or color. Tell your doctor if you see any changes.  Take over-the-counter and prescription medicines only as told by your doctor.  Keep all follow-up visits as told by your doctor. This is important. Contact a doctor if:  You have a fever.  You have new symptoms.  Your symptoms get worse. Get help right away if:  The area where your leg meets your lower belly has: ? Pain that gets worse suddenly. ? A bulge that gets bigger suddenly, and it does not get smaller after that. ? A bulge that turns red or purple. ? A bulge that is painful when you touch it.  You are a man, and your scrotum: ? Suddenly feels painful. ? Suddenly changes in size.  You cannot push the hernia in by very gently pressing on it when you are lying down. Do not try to force the bulge back in if it will not push in easily.  You feel sick to your stomach (nauseous), and that feeling  does not go away.  You throw up (vomit), and that keeps happening.  You have a fast heartbeat.  You cannot poop (have a bowel movement) or pass gas. These symptoms may be an emergency. Do not wait to see if the symptoms will go away. Get medical help right away. Call your local emergency services (911 in the [place] Summary  An inguinal hernia is when fat or your intestines push through a weak spot in a muscle where your leg meets your lower belly (groin). This causes a rounded lump (bulge).  If you do not have symptoms, you may not need  treatment. If you have symptoms or a large hernia, you may need surgery.  Avoid lifting heavy objects. Also avoid standing for long amounts of time.  Do not try to force the bulge back in if it will not push in easily. This information is not intended to replace advice given to you by your health care provider. Make sure you discuss any questions you have with your health care provider. Document Revised: 03/06/2017 Document Reviewed: 11/04/2016 Elsevier Patient Education  2020 [employer].

## 2020-02-14 NOTE — Patient Instructions (Addendum)
Thank you for coming to the office today.  Please go straight to local General Surgery Office now. They can see you today  [facility] Address: [address], [place], [place] [address] Phone: [phone]  Hernia is caused by a weakness in your abdominal or groin muscles, and is caused by bowel or fatty tissue pushing through this weak spot causing pain and bulging.  Recommend a "Hernia Truss", try to wear this regularly (do not need to wear to bed if pain is improved), until it feels better and then only with activities  You can find a Truss OTC at [facility]  Or can try a medical supply pharmacy like:  Try to find positions that give you most relief, likely laying down with head lower than body will allow the hernia bulging to go back into place and feel better.  May take Tylenol as needed. Recommend to start taking Tylenol Extra Strength 500mg  tabs - take 1 to 2 tabs per dose (max 1000mg ) every 6-8 hours for pain (take regularly, don't skip a dose for next 7 days), max 24 hour daily dose is 6 tablets or 3000mg . In the future you can repeat the same everyday Tylenol course for 1-2 weeks at a time.   Can try topical ice packs or muscle rub if burning nerve sensation.  If significant worsening pain or you get bulging that does NOT go down or go away and CANNOT push back in, or nausea, vomiting, then it is very important to go directly to hospital ED for more immediate evaluation, as this can be a life threatening surgical emergency   Please schedule a Follow-up Appointment to: Return if symptoms worsen or fail to improve, for hernia.  If you have any other questions or concerns, please feel free to call the office or send a message through MyChart. You may also schedule an earlier appointment if necessary.  Additionally, you may be receiving a survey about your experience at our office within a few days to 1 week by e-mail or mail. We value your  feedback.  , DO [facility], 

## 2020-02-14 NOTE — Progress Notes (Signed)
Subjective:    Patient ID: Kevin [name], male    DOB: [birth year], 79 y.o.   MRN: [id number]  Kevin [name] is a 79 y.o. male presenting on 02/14/2020 for hernia (Right side pain--intermittent and sharp)  Patient presents for a same day appointment.   HPI   Right Inguinal Hernia, acute pain Prior hernia 2006 repair bilateral inguinal hernia at that time. Surgery done in [place], do not have detailed record. He now has had sudden onset new hernia pain and bulging, new problem today, coughing triggered and has had episodic or intermittent symptoms of severe pain, seems more persistent now, he says it is reducible and will go back but then can bulge out again. He has not had constipation or abnormal bowel habits. No heavy lifting injury Denies any trauma or other injury or cause of pain.  UTD COVID Vaccine j&J 04/2019, no documented booster at this time.   Depression screen [facility] 2/9 03/09/2019 12/27/2018 09/28/2018  Decreased Interest 0 0 0  Down, Depressed, Hopeless 0 0 0  PHQ - 2 Score 0 0 0  Altered sleeping - 0 -  Tired, decreased energy - 0 -  Change in appetite - 0 -  Feeling bad or failure about yourself  - 0 -  Trouble concentrating - 0 -  Moving slowly or fidgety/restless - 0 -  Suicidal thoughts - 0 -  PHQ-9 Score - 0 -    Social History   Tobacco Use  . Smoking status: Former Smoker    Quit date: 02/16/2009    Years since quitting: 11.0  . Smokeless tobacco: Never Used  Vaping Use  . Vaping Use: Never used  Substance Use Topics  . Alcohol use: Yes    Alcohol/week: 0.0 standard drinks    Comment: rarely   . Drug use: No    Review of Systems Per HPI unless specifically indicated above     Objective:    BP (!) 159/84   Pulse (!) 55   Temp (!) 97.3 F (36.3 C) (Temporal)   Resp 16   Ht 5\' 8"  (1.727 m)   Wt 151 lb (68.5 kg)   SpO2 100%   BMI 22.96 kg/m   Wt Readings from Last 3 Encounters:  02/14/20 151 lb (68.5 kg)  09/19/19 145 lb 9.6 oz (66  kg)  03/14/19 155 lb (70.3 kg)    Physical Exam Vitals and nursing note reviewed.  Constitutional:      General: He is not in acute distress.    Appearance: He is well-developed and well-nourished. He is not diaphoretic.     Comments: Well-appearing, patient in pain, cooperative  HENT:     Head: Normocephalic and atraumatic.     Mouth/Throat:     Mouth: Oropharynx is clear and moist.  Eyes:     General:        Right eye: No discharge.        Left eye: No discharge.     Conjunctiva/sclera: Conjunctivae normal.  Cardiovascular:     Rate and Rhythm: Normal rate.  Pulmonary:     Effort: Pulmonary effort is normal.  Abdominal:     Hernia: A hernia (Right inguinal large area of bulging with possible direct inguinal hernia with large asymmetry) is present.  Musculoskeletal:        General: No edema.  Skin:    General: Skin is warm and dry.     Findings: No erythema or rash.  Neurological:  Mental Status: He is alert and oriented to person, place, and time.  Psychiatric:        Mood and Affect: Mood and affect normal.        Behavior: Behavior normal.     Comments: Well groomed, good eye contact, normal speech and thoughts        Assessment & Plan:   Problem List Items Addressed This Visit   None   Visit Diagnoses    Right inguinal hernia    -  Primary   Relevant Orders   Ambulatory referral to General Surgery      Consistent with acute Right inguinal hernia - Seems to be acute onset today, prior old history 2006 repair bilateral - triggered by cough by his report - large bulging with severe pain, evidence that it is reducible but not easily reducible in office today  URGENT referral to Gen Surgery, called [facility]. They will see him immediately, patient sent to their office from ours now.  Return criteria given  Orders Placed This Encounter  Procedures  . Ambulatory referral to General Surgery    Referral Priority:   Urgent    Referral Type:    Surgical    Referral Reason:   Specialty Services Required    Requested Specialty:   General Surgery    Number of Visits Requested:   1     No orders of the defined types were placed in this encounter.     Follow up plan: Return if symptoms worsen or fail to improve, for hernia.   Saralyn Pilar, DO [facility] 02/14/2020, 2:45 PM

## 2020-02-14 NOTE — H&P (View-Only) (Signed)
02/14/2020  Reason for Visit:  Painful recurrent right inguinal hernia  Referring Provider:  Saralyn Pilar, DO  History of Present Illness: Kevin [name] is a 79 y.o. male presenting urgently as referral for painful right inguinal hernia.  The patient has a history of prior open right inguinal hernia repair in 1990 and open left inguinal hernia repair in 2006.  He reports that about 1-2 months ago, he started having bulging again in the right groin.  He has been able to reduce it himself without issues, but today, he developed acute onset of groin pain starting about 10 am and he has been unable to reduce the hernia himself.  At this point, it is painful to push on it.  He presented to his PCP and he was referred to [place] urgently.  Denies any fevers, chills, nausea, vomiting, other abdominal pain.  Past Medical History: Past Medical History:  Diagnosis Date  . Arthritis      Past Surgical History: Past Surgical History:  Procedure Laterality Date  . COLONOSCOPY WITH PROPOFOL N/A 06/05/2015   Procedure: COLONOSCOPY WITH PROPOFOL;  Surgeon: Kieth Brightly, MD;  Location: [facility] ENDOSCOPY;  Service: Endoscopy;  Laterality: N/A;  . HERNIA REPAIR Left 03/24/2004   inguinal   . HERNIA REPAIR Right 1990   inguinal    Home Medications: Prior to Admission medications   Medication Sig Start Date End Date Taking? Authorizing Provider  gabapentin (NEURONTIN) 100 MG capsule TAKE 1 CAPSULE DAILY, INCREASE BY 1 CAPSULE EVERY 2 TO 3 DAYS AS TOLERATED UP TO THREE TIMES DAILY, OR MAY TAKE 3 CAPSULES AT ONCE EVERY EVENING 09/08/19  Yes Karamalegos, Netta Neat, DO  mirtazapine (REMERON) 15 MG tablet Take 1 tablet (15 mg total) by mouth at bedtime. 02/22/18  Yes Karamalegos, Netta Neat, DO  naproxen (NAPROSYN) 500 MG tablet Take 1 tablet (500 mg total) by mouth 2 (two) times daily with a meal. For 1-2 weeks then as needed 12/27/18  Yes Karamalegos, Alexander J, DO  omeprazole (PRILOSEC) 20 MG  capsule TAKE 1 CAPSULE(20 MG) BY MOUTH DAILY BEFORE BREAKFAST AS NEEDED 09/08/19  Yes Karamalegos, Alexander J, DO  sildenafil (REVATIO) 20 MG tablet TAKE 2 TABLETS BY MOUTH 30 MINUTES PRIORTO SEX DAILY AS NEEDED. DO NOT REPEAT DOSE IN ONE DAY 10/10/19  Yes Karamalegos, Netta Neat, DO  triamcinolone cream (KENALOG) 0.5 % Apply 1 application topically 2 (two) times daily. To affected areas, for up to 2 weeks. 09/28/18  Yes Smitty Cords, DO    Allergies: Allergies  Allergen Reactions  . Shellfish Allergy   . Tomato Itching    Social History:  reports that he quit smoking about 11 years ago. He has never used smokeless tobacco. He reports current alcohol use. He reports that he does not use drugs.   Family History: Family History  Problem Relation Age of Onset  . Ovarian cancer Mother   . Alcohol abuse Father     Review of Systems: Review of Systems  Constitutional: Negative for chills and fever.  HENT: Negative for hearing loss.   Respiratory: Negative for shortness of breath.   Cardiovascular: Negative for chest pain.  Gastrointestinal: Positive for abdominal pain. Negative for constipation, diarrhea, nausea and vomiting.  Genitourinary: Negative for dysuria.  Musculoskeletal: Negative for myalgias.  Skin: Negative for rash.  Neurological: Negative for dizziness.  Psychiatric/Behavioral: Negative for depression.    Physical Exam BP (!) 159/100   Pulse (!) 56   Temp 98 F (36.7 C) (Oral)  Ht 5\' 8"  (1.727 m)   Wt 151 lb 6.4 oz (68.7 kg)   SpO2 99%   BMI 23.02 kg/m  CONSTITUTIONAL: No acute distress HEENT:  Normocephalic, atraumatic, extraocular motion intact. NECK: Trachea is midline, and there is no jugular venous distension.  RESPIRATORY:  Normal respiratory effort without pathologic use of accessory muscles. CARDIOVASCULAR: Regular rhythm and rate. GI: The abdomen is soft, non-distended.  He is tender to palpation in the right groin.  Scars from prior  open bilateral repairs are well healed, but he has an area of bulging in the medial right groin, which is tender to palpation.  There is no overlying skin changes, and the area is still soft to touch.  With some manipulation, the hernia was reduced at bedside.  He reported feeling better immediately after.  MUSCULOSKELETAL:  Normal muscle strength and tone in all four extremities.  No peripheral edema or cyanosis. SKIN: Skin turgor is normal. There are no pathologic skin lesions.  NEUROLOGIC:  Motor and sensation is grossly normal.  Cranial nerves are grossly intact. PSYCH:  Alert and oriented to person, place and time. Affect is normal.  Laboratory Analysis: No results found for this or any previous visit (from the past 24 hour(s)).  Imaging: No results found.  Assessment and Plan: This is a 79 y.o. male with a recurrent right inguinal hernia repair.  --Discussed with the patient that I was able to reduce his hernia.  He appeared much more comfortable afterwards and at the end of our visit, he is able to walk much better.  Reviewed with him things to avoid and things to do to help prevent the hernia from bulging more.  However, given the significance of today's events, I do recommend repairing the hernia soon. --Discussed with him that since he had open repairs previously, we can approach his right inguinal repair robotically.  Discussed with him the risks of bleeding, infection, injury to surrounding structures, and he's willing to proceed.  Discussed the surgery at length and reviewed post-op activity restrictions with him.  He is aware that he would need COVID testing prior to surgery.  He understands and he is willing to proceed. --Will schedule for next week for 02/20/20.  Will get medical clearance.  Face-to-face time spent with the patient and care providers was 60 minutes, with more than 50% of the time spent counseling, educating, and coordinating care of the patient.     04/19/20, MD [facility]

## 2020-02-14 NOTE — Telephone Encounter (Signed)
Patient wanted appt for pain due to hernia. C/o increasing pain in right lower abdomen at hairline since 10 am today. Bulge noted and painful. Denies fever. Patient scheduled for appt today. Patient only wants to go to PCP . Care advise given. Patient verbalized understanding of care advise and to call back or go to ED if symptoms worsen.   Reason for Disposition . [1] Constant abdominal pain AND [2] present > 2 hours  (NO pain or tenderness of hernia)  Answer Assessment - Initial Assessment Questions 1. ONSET:  "When did this first appear?"     Bulging noted this am and will not go back into right lower abdomen at hairline 2. APPEARANCE: "What does it look like?"     bulge 3. SIZE: "How big is it?" (inches, cm or compare to coins, fruit)     na 4. LOCATION: "Where exactly is the hernia located?"     Right lower abdomen at hairline  5. PATTERN: "Does the swelling come and go, or has it been constant since it started?"     Constant since 10 am  6. PAIN: "Is there any pain?" If Yes, ask: "How bad is it?"  (Scale 1-10; or mild, moderate, severe)     pain 7. DIAGNOSIS: "Have you been seen by a doctor for this?" "Did the doctor diagnose you as having a hernia?"     Yes . Have had hernias before  8. OTHER SYMPTOMS: "Do you have any other symptoms?" (e.g., fever, abdominal pain, vomiting)     Pain  9. PREGNANCY: "Is there any chance you are pregnant?" "When was your last menstrual period?"     na  Protocols used: HERNIA-A-AH

## 2020-02-14 NOTE — Progress Notes (Signed)
02/14/2020  Reason for Visit:  Painful recurrent right inguinal hernia  Referring Provider:  Saralyn Pilar, DO  History of Present Illness: Kevin [name] is a 79 y.o. male presenting urgently as referral for painful right inguinal hernia.  The patient has a history of prior open right inguinal hernia repair in 1990 and open left inguinal hernia repair in 2006.  He reports that about 1-2 months ago, he started having bulging again in the right groin.  He has been able to reduce it himself without issues, but today, he developed acute onset of groin pain starting about 10 am and he has been unable to reduce the hernia himself.  At this point, it is painful to push on it.  He presented to his PCP and he was referred to [place] urgently.  Denies any fevers, chills, nausea, vomiting, other abdominal pain.  Past Medical History: Past Medical History:  Diagnosis Date  . Arthritis      Past Surgical History: Past Surgical History:  Procedure Laterality Date  . COLONOSCOPY WITH PROPOFOL N/A 06/05/2015   Procedure: COLONOSCOPY WITH PROPOFOL;  Surgeon: Kieth Brightly, MD;  Location: [facility] ENDOSCOPY;  Service: Endoscopy;  Laterality: N/A;  . HERNIA REPAIR Left 03/24/2004   inguinal   . HERNIA REPAIR Right 1990   inguinal    Home Medications: Prior to Admission medications   Medication Sig Start Date End Date Taking? Authorizing Provider  gabapentin (NEURONTIN) 100 MG capsule TAKE 1 CAPSULE DAILY, INCREASE BY 1 CAPSULE EVERY 2 TO 3 DAYS AS TOLERATED UP TO THREE TIMES DAILY, OR MAY TAKE 3 CAPSULES AT ONCE EVERY EVENING 09/08/19  Yes Karamalegos, Netta Neat, DO  mirtazapine (REMERON) 15 MG tablet Take 1 tablet (15 mg total) by mouth at bedtime. 02/22/18  Yes Karamalegos, Netta Neat, DO  naproxen (NAPROSYN) 500 MG tablet Take 1 tablet (500 mg total) by mouth 2 (two) times daily with a meal. For 1-2 weeks then as needed 12/27/18  Yes Karamalegos, Alexander J, DO  omeprazole (PRILOSEC) 20 MG  capsule TAKE 1 CAPSULE(20 MG) BY MOUTH DAILY BEFORE BREAKFAST AS NEEDED 09/08/19  Yes Karamalegos, Alexander J, DO  sildenafil (REVATIO) 20 MG tablet TAKE 2 TABLETS BY MOUTH 30 MINUTES PRIORTO SEX DAILY AS NEEDED. DO NOT REPEAT DOSE IN ONE DAY 10/10/19  Yes Karamalegos, Netta Neat, DO  triamcinolone cream (KENALOG) 0.5 % Apply 1 application topically 2 (two) times daily. To affected areas, for up to 2 weeks. 09/28/18  Yes Smitty Cords, DO    Allergies: Allergies  Allergen Reactions  . Shellfish Allergy   . Tomato Itching    Social History:  reports that he quit smoking about 11 years ago. He has never used smokeless tobacco. He reports current alcohol use. He reports that he does not use drugs.   Family History: Family History  Problem Relation Age of Onset  . Ovarian cancer Mother   . Alcohol abuse Father     Review of Systems: Review of Systems  Constitutional: Negative for chills and fever.  HENT: Negative for hearing loss.   Respiratory: Negative for shortness of breath.   Cardiovascular: Negative for chest pain.  Gastrointestinal: Positive for abdominal pain. Negative for constipation, diarrhea, nausea and vomiting.  Genitourinary: Negative for dysuria.  Musculoskeletal: Negative for myalgias.  Skin: Negative for rash.  Neurological: Negative for dizziness.  Psychiatric/Behavioral: Negative for depression.    Physical Exam BP (!) 159/100   Pulse (!) 56   Temp 98 F (36.7 C) (Oral)  Ht 5' 8" (1.727 m)   Wt 151 lb 6.4 oz (68.7 kg)   SpO2 99%   BMI 23.02 kg/m  CONSTITUTIONAL: No acute distress HEENT:  Normocephalic, atraumatic, extraocular motion intact. NECK: Trachea is midline, and there is no jugular venous distension.  RESPIRATORY:  Normal respiratory effort without pathologic use of accessory muscles. CARDIOVASCULAR: Regular rhythm and rate. GI: The abdomen is soft, non-distended.  He is tender to palpation in the right groin.  Scars from prior  open bilateral repairs are well healed, but he has an area of bulging in the medial right groin, which is tender to palpation.  There is no overlying skin changes, and the area is still soft to touch.  With some manipulation, the hernia was reduced at bedside.  He reported feeling better immediately after.  MUSCULOSKELETAL:  Normal muscle strength and tone in all four extremities.  No peripheral edema or cyanosis. SKIN: Skin turgor is normal. There are no pathologic skin lesions.  NEUROLOGIC:  Motor and sensation is grossly normal.  Cranial nerves are grossly intact. PSYCH:  Alert and oriented to person, place and time. Affect is normal.  Laboratory Analysis: No results found for this or any previous visit (from the past 24 hour(s)).  Imaging: No results found.  Assessment and Plan: This is a 79 y.o. male with a recurrent right inguinal hernia repair.  --Discussed with the patient that I was able to reduce his hernia.  He appeared much more comfortable afterwards and at the end of our visit, he is able to walk much better.  Reviewed with him things to avoid and things to do to help prevent the hernia from bulging more.  However, given the significance of today's events, I do recommend repairing the hernia soon. --Discussed with him that since he had open repairs previously, we can approach his right inguinal repair robotically.  Discussed with him the risks of bleeding, infection, injury to surrounding structures, and he's willing to proceed.  Discussed the surgery at length and reviewed post-op activity restrictions with him.  He is aware that he would need COVID testing prior to surgery.  He understands and he is willing to proceed. --Will schedule for next week for 02/20/20.  Will get medical clearance.  Face-to-face time spent with the patient and care providers was 60 minutes, with more than 50% of the time spent counseling, educating, and coordinating care of the patient.     Lanyah Spengler Luis  Anayeli Arel, MD [facility]   

## 2020-02-15 ENCOUNTER — Telehealth: Payer: Self-pay | Admitting: Surgery

## 2020-02-15 ENCOUNTER — Telehealth: Payer: Self-pay

## 2020-02-15 NOTE — Telephone Encounter (Signed)
Outbound call made to the pt; however, he was not in a place where he could take down the necessary information.  When he returns, plz provide the following Pre-Admission date/time, COVID Testing date and Surgery date:  Surgery Date: 02/20/20 Preadmission Testing Date: 02/19/20 (phone 8a-1p) Covid Testing Date: 02/16/20 - patient advised to [facility] to [facility] ([address] [place]) ** MUST ARRIVE BETWEEN 8a-12p SITE CLOSING @ NOON **  Patient has been made aware to call [phone], between 1-3:00pm the day before surgery, to find out what time to arrive for surgery.

## 2020-02-16 ENCOUNTER — Other Ambulatory Visit: Payer: Self-pay

## 2020-02-16 ENCOUNTER — Other Ambulatory Visit
Admission: RE | Admit: 2020-02-16 | Discharge: 2020-02-16 | Disposition: A | Discharge status: Home / Self Care | Payer: Medicare HMO | Source: Ambulatory Visit | Attending: Surgery | Admitting: Surgery

## 2020-02-16 DIAGNOSIS — Z01812 Encounter for preprocedural laboratory examination: Secondary | ICD-10-CM | POA: Insufficient documentation

## 2020-02-16 DIAGNOSIS — Z20822 Contact with and (suspected) exposure to covid-19: Secondary | ICD-10-CM | POA: Insufficient documentation

## 2020-02-17 LAB — SARS CORONAVIRUS 2 (TAT 6-24 HRS): SARS Coronavirus 2: NEGATIVE

## 2020-02-19 ENCOUNTER — Other Ambulatory Visit: Payer: Self-pay

## 2020-02-19 ENCOUNTER — Other Ambulatory Visit: Payer: Self-pay | Admitting: Family Medicine

## 2020-02-19 ENCOUNTER — Encounter
Admission: RE | Admit: 2020-02-19 | Discharge: 2020-02-19 | Disposition: A | Discharge status: Home / Self Care | Payer: Medicare HMO | Source: Ambulatory Visit | Attending: Surgery | Admitting: Surgery

## 2020-02-19 DIAGNOSIS — M7521 Bicipital tendinitis, right shoulder: Secondary | ICD-10-CM

## 2020-02-19 HISTORY — DX: Gastro-esophageal reflux disease without esophagitis: K21.9

## 2020-02-19 NOTE — Patient Instructions (Addendum)
Your procedure is scheduled on: Tuesday, January 4 Report to the Registration Desk on the 1st floor of the [facility] at 7:30 am  REMEMBER: Instructions that are not followed completely may result in serious medical risk, up to and including death; or upon the discretion of your surgeon and anesthesiologist your surgery may need to be rescheduled.  Do not eat food after midnight the night before surgery.  No gum chewing, lozengers or hard candies.  You may however, drink CLEAR liquids up to 2 hours before you are scheduled to arrive for your surgery. Do not drink anything within 2 hours of your scheduled arrival time.  Clear liquids include: - water  - apple juice without pulp - gatorade (not RED, PURPLE, OR BLUE) - black coffee or tea (Do NOT add milk or creamers to the coffee or tea) Do NOT drink anything that is not on this list.  TAKE THESE MEDICATIONS THE MORNING OF SURGERY WITH A SIP OF WATER:  1.  Omeprazole (Prilosec)  (take one the night before and one on the morning of surgery - helps to prevent nausea after surgery.)  One week prior to surgery: Stop Anti-inflammatories (NSAIDS) such as Advil, Aleve, Ibuprofen, Motrin, Naproxen, Naprosyn and Aspirin based products such as Excedrin, Goodys Powder, BC Powder. Stop ANY OVER THE COUNTER supplements until after surgery.  No Alcohol for 24 hours before or after surgery.  No Smoking including e-cigarettes for 24 hours prior to surgery.  No chewable tobacco products for at least 6 hours prior to surgery.  No nicotine patches on the day of surgery.  Do not use any "recreational" drugs for at least a week prior to your surgery.  Please be advised that the combination of cocaine and anesthesia may have negative outcomes, up to and including death. If you test positive for cocaine, your surgery will be cancelled.  On the morning of surgery brush your teeth with toothpaste and water, you may rinse your mouth with mouthwash if you  wish. Do not swallow any toothpaste or mouthwash.  Do not wear jewelry, make-up, hairpins, clips or nail polish.  Do not wear lotions, powders, or perfumes.   Do not shave body from the neck down 48 hours prior to surgery just in case you cut yourself which could leave a site for infection.   Contact lenses, hearing aids and dentures may not be worn into surgery.  Do not bring valuables to the hospital. [facility] is not responsible for any missing/lost belongings or valuables.   Shower using antibacterial Soap on the morning of surgery.  Notify your doctor if there is any change in your medical condition (cold, fever, infection).  Wear comfortable clothing (specific to your surgery type) to the hospital.  Plan for stool softeners for home use; pain medications have a tendency to cause constipation. You can also help prevent constipation by eating foods high in fiber such as fruits and vegetables and drinking plenty of fluids as your diet allows.  After surgery, you can help prevent lung complications by doing breathing exercises.  Take deep breaths and cough every 1-2 hours. Your doctor may order a device called an Incentive Spirometer to help you take deep breaths. When coughing or sneezing, hold a pillow firmly against your incision with both hands. This is called "splinting." Doing this helps protect your incision. It also decreases belly discomfort.  If you are being discharged the day of surgery, you will not be allowed to drive home. You will need  a responsible adult (18 years or older) to drive you home and stay with you that night.   If you are taking public transportation, you will need to have a responsible adult (18 years or older) with you. Please confirm with your physician that it is acceptable to use public transportation.   Please call the Pre-admissions Testing Dept. at [phone] if you have any questions about these instructions.  Visitation  Policy:  Patients undergoing a surgery or procedure may have one family member or support person with them as long as that person is not COVID-19 positive or experiencing its symptoms.  That person may remain in the waiting area during the procedure.

## 2020-02-20 ENCOUNTER — Encounter
Admission: RE | Disposition: A | Discharge status: Home / Self Care | Payer: Self-pay | Source: Home / Self Care | Attending: Surgery

## 2020-02-20 ENCOUNTER — Encounter: Payer: Self-pay | Admitting: Surgery

## 2020-02-20 ENCOUNTER — Ambulatory Visit
Admission: RE | Admit: 2020-02-20 | Discharge: 2020-02-20 | Disposition: A | Discharge status: Home / Self Care | Payer: Medicare HMO | Attending: Surgery | Admitting: Surgery

## 2020-02-20 ENCOUNTER — Ambulatory Visit: Payer: Medicare HMO | Admitting: Certified Registered Nurse Anesthetist

## 2020-02-20 DIAGNOSIS — Z791 Long term (current) use of non-steroidal anti-inflammatories (NSAID): Secondary | ICD-10-CM | POA: Insufficient documentation

## 2020-02-20 DIAGNOSIS — Z87891 Personal history of nicotine dependence: Secondary | ICD-10-CM | POA: Insufficient documentation

## 2020-02-20 DIAGNOSIS — Z01810 Encounter for preprocedural cardiovascular examination: Secondary | ICD-10-CM | POA: Diagnosis not present

## 2020-02-20 DIAGNOSIS — K4091 Unilateral inguinal hernia, without obstruction or gangrene, recurrent: Secondary | ICD-10-CM | POA: Insufficient documentation

## 2020-02-20 DIAGNOSIS — Z79899 Other long term (current) drug therapy: Secondary | ICD-10-CM | POA: Insufficient documentation

## 2020-02-20 HISTORY — PX: XI ROBOTIC ASSISTED INGUINAL HERNIA REPAIR WITH MESH: SHX6706

## 2020-02-20 SURGERY — REPAIR, HERNIA, INGUINAL, ROBOT-ASSISTED, LAPAROSCOPIC, USING MESH
Anesthesia: General | Laterality: Right

## 2020-02-20 MED ORDER — FENTANYL CITRATE (PF) 100 MCG/2ML IJ SOLN
25.0000 ug | INTRAMUSCULAR | Status: DC | PRN
  Administered 2020-02-20 (×3): 25 ug via INTRAVENOUS

## 2020-02-20 MED ORDER — DEXAMETHASONE SODIUM PHOSPHATE 10 MG/ML IJ SOLN
INTRAMUSCULAR | Status: DC | PRN
  Administered 2020-02-20: 10 mg via INTRAVENOUS

## 2020-02-20 MED ORDER — ACETAMINOPHEN 500 MG PO TABS
ORAL_TABLET | ORAL | Status: AC
  Administered 2020-02-20: 1000 mg via ORAL
  Filled 2020-02-20: qty 2

## 2020-02-20 MED ORDER — EPHEDRINE 5 MG/ML INJ
INTRAVENOUS | Status: AC
  Filled 2020-02-20: qty 10

## 2020-02-20 MED ORDER — BUPIVACAINE LIPOSOME 1.3 % IJ SUSP
INTRAMUSCULAR | Status: DC | PRN
  Administered 2020-02-20: 20 mL

## 2020-02-20 MED ORDER — PROPOFOL 10 MG/ML IV BOLUS
INTRAVENOUS | Status: DC | PRN
  Administered 2020-02-20: 150 mg via INTRAVENOUS

## 2020-02-20 MED ORDER — BUPIVACAINE LIPOSOME 1.3 % IJ SUSP
INTRAMUSCULAR | Status: AC
  Filled 2020-02-20: qty 20

## 2020-02-20 MED ORDER — FENTANYL CITRATE (PF) 100 MCG/2ML IJ SOLN
INTRAMUSCULAR | Status: AC
  Filled 2020-02-20: qty 2

## 2020-02-20 MED ORDER — LABETALOL HCL 5 MG/ML IV SOLN
INTRAVENOUS | Status: AC
  Filled 2020-02-20: qty 4

## 2020-02-20 MED ORDER — PROPOFOL 10 MG/ML IV BOLUS
INTRAVENOUS | Status: AC
  Filled 2020-02-20: qty 20

## 2020-02-20 MED ORDER — EPHEDRINE SULFATE 50 MG/ML IJ SOLN
INTRAMUSCULAR | Status: DC | PRN
  Administered 2020-02-20: 10 mg via INTRAVENOUS

## 2020-02-20 MED ORDER — BUPIVACAINE-EPINEPHRINE 0.25% -1:200000 IJ SOLN
INTRAMUSCULAR | Status: DC | PRN
  Administered 2020-02-20: 30 mL

## 2020-02-20 MED ORDER — OXYCODONE HCL 5 MG PO TABS: 5.0000 mg | ORAL_TABLET | ORAL | 0 refills | Status: DC | PRN

## 2020-02-20 MED ORDER — LIDOCAINE HCL (CARDIAC) PF 100 MG/5ML IV SOSY
PREFILLED_SYRINGE | INTRAVENOUS | Status: DC | PRN
  Administered 2020-02-20: 100 mg via INTRAVENOUS

## 2020-02-20 MED ORDER — CHLORHEXIDINE GLUCONATE CLOTH 2 % EX PADS: 6.0000 | MEDICATED_PAD | Freq: Once | CUTANEOUS | Status: DC

## 2020-02-20 MED ORDER — SUGAMMADEX SODIUM 200 MG/2ML IV SOLN
INTRAVENOUS | Status: DC | PRN
  Administered 2020-02-20: 200 mg via INTRAVENOUS

## 2020-02-20 MED ORDER — ONDANSETRON HCL 4 MG/2ML IJ SOLN: 4.0000 mg | Freq: Once | INTRAMUSCULAR | Status: DC | PRN

## 2020-02-20 MED ORDER — CELECOXIB 200 MG PO CAPS: 200.0000 mg | ORAL_CAPSULE | ORAL | Status: AC

## 2020-02-20 MED ORDER — ORAL CARE MOUTH RINSE: 15.0000 mL | Freq: Once | OROMUCOSAL | Status: AC

## 2020-02-20 MED ORDER — LIDOCAINE HCL (PF) 2 % IJ SOLN
INTRAMUSCULAR | Status: AC
  Filled 2020-02-20: qty 5

## 2020-02-20 MED ORDER — ACETAMINOPHEN 500 MG PO TABS: 1000.0000 mg | ORAL_TABLET | ORAL | Status: AC

## 2020-02-20 MED ORDER — DEXAMETHASONE SODIUM PHOSPHATE 10 MG/ML IJ SOLN
INTRAMUSCULAR | Status: AC
  Filled 2020-02-20: qty 1

## 2020-02-20 MED ORDER — CEFAZOLIN SODIUM-DEXTROSE 2-4 GM/100ML-% IV SOLN
2.0000 g | INTRAVENOUS | Status: AC
  Administered 2020-02-20: 2 g via INTRAVENOUS

## 2020-02-20 MED ORDER — CHLORHEXIDINE GLUCONATE 0.12 % MT SOLN
OROMUCOSAL | Status: AC
  Administered 2020-02-20: 15 mL via OROMUCOSAL
  Filled 2020-02-20: qty 15

## 2020-02-20 MED ORDER — ROCURONIUM BROMIDE 100 MG/10ML IV SOLN
INTRAVENOUS | Status: DC | PRN
  Administered 2020-02-20: 10 mg via INTRAVENOUS
  Administered 2020-02-20: 60 mg via INTRAVENOUS

## 2020-02-20 MED ORDER — GABAPENTIN 300 MG PO CAPS
ORAL_CAPSULE | ORAL | Status: AC
  Filled 2020-02-20: qty 1

## 2020-02-20 MED ORDER — ROCURONIUM BROMIDE 10 MG/ML (PF) SYRINGE
PREFILLED_SYRINGE | INTRAVENOUS | Status: AC
  Filled 2020-02-20: qty 10

## 2020-02-20 MED ORDER — IBUPROFEN 600 MG PO TABS: 600.0000 mg | ORAL_TABLET | Freq: Three times a day (TID) | ORAL | 1 refills | Status: DC | PRN

## 2020-02-20 MED ORDER — CHLORHEXIDINE GLUCONATE 0.12 % MT SOLN: 15.0000 mL | Freq: Once | OROMUCOSAL | Status: AC

## 2020-02-20 MED ORDER — CELECOXIB 200 MG PO CAPS
ORAL_CAPSULE | ORAL | Status: AC
  Administered 2020-02-20: 200 mg via ORAL
  Filled 2020-02-20: qty 1

## 2020-02-20 MED ORDER — ONDANSETRON HCL 4 MG/2ML IJ SOLN
INTRAMUSCULAR | Status: AC
  Filled 2020-02-20: qty 2

## 2020-02-20 MED ORDER — GABAPENTIN 100 MG PO CAPS: 200.0000 mg | ORAL_CAPSULE | ORAL | Status: DC

## 2020-02-20 MED ORDER — LACTATED RINGERS IV SOLN: INTRAVENOUS | Status: DC

## 2020-02-20 MED ORDER — FENTANYL CITRATE (PF) 100 MCG/2ML IJ SOLN
INTRAMUSCULAR | Status: DC | PRN
  Administered 2020-02-20: 25 ug via INTRAVENOUS
  Administered 2020-02-20 (×2): 50 ug via INTRAVENOUS

## 2020-02-20 MED ORDER — FENTANYL CITRATE (PF) 100 MCG/2ML IJ SOLN
INTRAMUSCULAR | Status: AC
  Administered 2020-02-20: 25 ug via INTRAVENOUS
  Filled 2020-02-20: qty 2

## 2020-02-20 MED ORDER — BUPIVACAINE-EPINEPHRINE (PF) 0.25% -1:200000 IJ SOLN
INTRAMUSCULAR | Status: AC
  Filled 2020-02-20: qty 30

## 2020-02-20 MED ORDER — GABAPENTIN 100 MG PO CAPS
ORAL_CAPSULE | ORAL | Status: AC
  Filled 2020-02-20: qty 2

## 2020-02-20 MED ORDER — GLYCOPYRROLATE 0.2 MG/ML IJ SOLN
INTRAMUSCULAR | Status: DC | PRN
  Administered 2020-02-20: .2 mg via INTRAVENOUS

## 2020-02-20 MED ORDER — PHENYLEPHRINE HCL (PRESSORS) 10 MG/ML IV SOLN
INTRAVENOUS | Status: DC | PRN
  Administered 2020-02-20 (×2): 100 ug via INTRAVENOUS
  Administered 2020-02-20: 200 ug via INTRAVENOUS
  Administered 2020-02-20: 50 ug via INTRAVENOUS
  Administered 2020-02-20 (×2): 100 ug via INTRAVENOUS

## 2020-02-20 MED ORDER — ONDANSETRON HCL 4 MG/2ML IJ SOLN
INTRAMUSCULAR | Status: DC | PRN
  Administered 2020-02-20: 4 mg via INTRAVENOUS

## 2020-02-20 MED ORDER — CEFAZOLIN SODIUM-DEXTROSE 2-4 GM/100ML-% IV SOLN
INTRAVENOUS | Status: AC
  Filled 2020-02-20: qty 100

## 2020-02-20 SURGICAL SUPPLY — 60 items
"PENCIL ELECTRO HAND CTR " (MISCELLANEOUS) ×1 IMPLANT
ADH SKN CLS APL DERMABOND .7 (GAUZE/BANDAGES/DRESSINGS) ×1
APL PRP STRL LF DISP 70% ISPRP (MISCELLANEOUS) ×1
CANISTER SUCT 1200ML W/VALVE (MISCELLANEOUS) ×1 IMPLANT
CANNULA REDUC XI 12-8 STAPL (CANNULA) ×2
CANNULA REDUCER 12-8 DVNC XI (CANNULA) ×1 IMPLANT
CHLORAPREP W/TINT 26 (MISCELLANEOUS) ×2 IMPLANT
COVER TIP SHEARS 8 DVNC (MISCELLANEOUS) ×1 IMPLANT
COVER TIP SHEARS 8MM DA VINCI (MISCELLANEOUS) ×2
COVER WAND RF STERILE (DRAPES) ×2 IMPLANT
DEFOGGER SCOPE WARMER CLEARIFY (MISCELLANEOUS) ×2 IMPLANT
DERMABOND ADVANCED (GAUZE/BANDAGES/DRESSINGS) ×1
DERMABOND ADVANCED .7 DNX12 (GAUZE/BANDAGES/DRESSINGS) ×1 IMPLANT
DRAPE ARM DVNC X/XI (DISPOSABLE) ×4 IMPLANT
DRAPE COLUMN DVNC XI (DISPOSABLE) ×1 IMPLANT
DRAPE DA VINCI XI ARM (DISPOSABLE) ×8
DRAPE DA VINCI XI COLUMN (DISPOSABLE) ×2
ELECT CAUTERY BLADE 6.4 (BLADE) ×2 IMPLANT
ELECT REM PT RETURN 9FT ADLT (ELECTROSURGICAL) ×2
ELECTRODE REM PT RTRN 9FT ADLT (ELECTROSURGICAL) ×1 IMPLANT
GLOVE SURG SYN 7.0 (GLOVE) ×10 IMPLANT
GLOVE SURG SYN 7.0 PF PI (GLOVE) ×2 IMPLANT
GLOVE SURG SYN 7.5  E (GLOVE) ×10
GLOVE SURG SYN 7.5 E (GLOVE) ×5 IMPLANT
GLOVE SURG SYN 7.5 PF PI (GLOVE) ×2 IMPLANT
GOWN STRL REUS W/ TWL LRG LVL3 (GOWN DISPOSABLE) ×4 IMPLANT
GOWN STRL REUS W/TWL LRG LVL3 (GOWN DISPOSABLE) ×8
IRRIGATION STRYKERFLOW (MISCELLANEOUS) ×1 IMPLANT
IRRIGATOR STRYKERFLOW (MISCELLANEOUS)
IV NS 1000ML (IV SOLUTION)
IV NS 1000ML BAXH (IV SOLUTION) IMPLANT
KIT PINK PAD W/HEAD ARE REST (MISCELLANEOUS) ×2
KIT PINK PAD W/HEAD ARM REST (MISCELLANEOUS) ×1 IMPLANT
LABEL OR SOLS (LABEL) ×2 IMPLANT
MANIFOLD NEPTUNE II (INSTRUMENTS) ×2 IMPLANT
MESH 3DMAX 4X6 RT LRG (Mesh General) ×1 IMPLANT
MESH 3DMAX MID 4X6 RT LRG (Mesh General) IMPLANT
NDL INSUFFLATION 14GA 120MM (NEEDLE) ×1 IMPLANT
NEEDLE HYPO 22GX1.5 SAFETY (NEEDLE) ×2 IMPLANT
NEEDLE INSUFFLATION 14GA 120MM (NEEDLE) ×2 IMPLANT
OBTURATOR OPTICAL STANDARD 8MM (TROCAR) ×2
OBTURATOR OPTICAL STND 8 DVNC (TROCAR) ×1
OBTURATOR OPTICALSTD 8 DVNC (TROCAR) ×1 IMPLANT
PACK LAP CHOLECYSTECTOMY (MISCELLANEOUS) ×2 IMPLANT
PENCIL ELECTRO HAND CTR (MISCELLANEOUS) ×2 IMPLANT
SEAL CANN UNIV 5-8 DVNC XI (MISCELLANEOUS) ×3 IMPLANT
SEAL XI 5MM-8MM UNIVERSAL (MISCELLANEOUS) ×6
SET TUBE SMOKE EVAC HIGH FLOW (TUBING) ×2 IMPLANT
SOLUTION ELECTROLUBE (MISCELLANEOUS) ×2 IMPLANT
SPONGE LAP 18X18 RF (DISPOSABLE) ×2 IMPLANT
STAPLER CANNULA SEAL DVNC XI (STAPLE) ×1 IMPLANT
STAPLER CANNULA SEAL XI (STAPLE) ×2
SUT MNCRL AB 4-0 PS2 18 (SUTURE) ×2 IMPLANT
SUT VIC AB 2-0 SH 27 (SUTURE) ×2
SUT VIC AB 2-0 SH 27XBRD (SUTURE) ×2 IMPLANT
SUT VICRYL 0 AB UR-6 (SUTURE) ×3 IMPLANT
SUT VLOC 90 S/L VL9 GS22 (SUTURE) ×2 IMPLANT
TAPE TRANSPORE STRL 2 31045 (GAUZE/BANDAGES/DRESSINGS) ×2 IMPLANT
TRAY FOLEY SLVR 16FR LF STAT (SET/KITS/TRAYS/PACK) ×2 IMPLANT
TROCAR BALLN GELPORT 12X130M (ENDOMECHANICALS) ×2 IMPLANT

## 2020-02-20 NOTE — Progress Notes (Signed)
   02/20/20 0807  Clinical Encounter Type  Visited With Family  Visit Type Initial  Referral From Chaplain  Consult/Referral To Chaplain  Chaplain briefly visited with Pt's family member to see how she was doing while she waited. She has already received text from staff and feel comfortable with how the hospital notifies her.

## 2020-02-20 NOTE — Transfer of Care (Signed)
Immediate Anesthesia Transfer of Care Note  Patient: Kevin [name]  Procedure(s) Performed: XI ROBOTIC ASSISTED INGUINAL HERNIA REPAIR WITH MESH, recurrent (Right )  Patient Location: PACU  Anesthesia Type:General  Level of Consciousness: drowsy  Airway & Oxygen Therapy: Patient Spontanous Breathing and Patient connected to face mask oxygen  Post-op Assessment: Report given to RN and Post -op Vital signs reviewed and stable  Post vital signs: Reviewed and stable  Last Vitals:  Vitals Value Taken Time  BP 142/83 02/20/20 1216  Temp 36.1 C 02/20/20 1216  Pulse 69 02/20/20 1217  Resp 13 02/20/20 1217  SpO2 100 % 02/20/20 1217  Vitals shown include unvalidated device data.  Last Pain:  Vitals:   02/20/20 1216  TempSrc:   PainSc: Asleep         Complications: No complications documented.

## 2020-02-20 NOTE — Progress Notes (Signed)
   02/20/20 0807  Clinical Encounter Type  Visited With Family  Visit Type Initial  Referral From Chaplain  Consult/Referral To Chaplain

## 2020-02-20 NOTE — Anesthesia Procedure Notes (Signed)
Procedure Name: Intubation Date/Time: 02/20/2020 9:09 AM Performed by: Ginger Carne, CRNA Pre-anesthesia Checklist: Patient identified, Emergency Drugs available, Suction available, Patient being monitored and Timeout performed Patient Re-evaluated:Patient Re-evaluated prior to induction Oxygen Delivery Method: Circle system utilized Preoxygenation: Pre-oxygenation with 100% oxygen Induction Type: IV induction Ventilation: Mask ventilation without difficulty Laryngoscope Size: McGraph and 3 Grade View: Grade II Tube type: Oral Tube size: 7.0 mm Number of attempts: 1 Airway Equipment and Method: Stylet and Oral airway Placement Confirmation: positive ETCO2,  ETT inserted through vocal cords under direct vision and breath sounds checked- equal and bilateral Secured at: 21 cm Tube secured with: Tape Dental Injury: Teeth and Oropharynx as per pre-operative assessment

## 2020-02-20 NOTE — Interval H&P Note (Signed)
History and Physical Interval Note:  02/20/2020 8:48 AM  Kevin [name]  has presented today for surgery, with the diagnosis of recurrent right inguinal hernia.  The various methods of treatment have been discussed with the patient and family. After consideration of risks, benefits and other options for treatment, the patient has consented to  Procedure(s): XI ROBOTIC ASSISTED INGUINAL HERNIA REPAIR WITH MESH, recurrent (Right) as a surgical intervention.  The patient's history has been reviewed, patient examined, no change in status, stable for surgery.  I have reviewed the patient's chart and labs.  Questions were answered to the patient's satisfaction.     Jerusha Reising

## 2020-02-20 NOTE — Anesthesia Preprocedure Evaluation (Signed)
Anesthesia Evaluation  Patient identified by MRN, date of birth, ID band Patient awake    Reviewed: Allergy & Precautions, H&P , NPO status , Patient's Chart, lab work & pertinent test results, reviewed documented beta blocker date and time   Airway Mallampati: II  TM Distance: >3 FB Neck ROM: full    Dental  (+) Teeth Intact, Poor Dentition   Pulmonary neg pulmonary ROS, former smoker,    Pulmonary exam normal        Cardiovascular Exercise Tolerance: Good negative cardio ROS Normal cardiovascular exam Rhythm:regular Rate:Normal     Neuro/Psych negative neurological ROS  negative psych ROS   GI/Hepatic Neg liver ROS, GERD  Medicated,  Endo/Other  negative endocrine ROS  Renal/GU negative Renal ROS  negative genitourinary   Musculoskeletal   Abdominal   Peds  Hematology negative hematology ROS (+)   Anesthesia Other Findings Past Medical History: No date: Arthritis No date: GERD (gastroesophageal reflux disease) Past Surgical History: 06/05/2015: COLONOSCOPY WITH PROPOFOL; N/A     Comment:  Procedure: COLONOSCOPY WITH PROPOFOL;  Surgeon:               Kieth Brightly, MD;  Location: [facility] ENDOSCOPY;                Service: Endoscopy;  Laterality: N/A; 03/24/2004: HERNIA REPAIR; Left     Comment:  inguinal  1990: HERNIA REPAIR; Right     Comment:  inguinal BMI    Body Mass Index: 23.13 kg/m     Reproductive/Obstetrics negative OB ROS                             Anesthesia Physical Anesthesia Plan  ASA: II  Anesthesia Plan: General ETT   Post-op Pain Management:    Induction:   PONV Risk Score and Plan: 3  Airway Management Planned:   Additional Equipment:   Intra-op Plan:   Post-operative Plan:   Informed Consent: I have reviewed the patients History and Physical, chart, labs and discussed the procedure including the risks, benefits and alternatives for the  proposed anesthesia with the patient or authorized representative who has indicated his/her understanding and acceptance.     Dental Advisory Given  Plan Discussed with: CRNA  Anesthesia Plan Comments:         Anesthesia Quick Evaluation

## 2020-02-20 NOTE — Op Note (Signed)
Procedure Date:  02/20/2020  Pre-operative Diagnosis:  Recurrent right inguinal hernia  Post-operative Diagnosis:  Recurrent right inguinal hernia  Procedure: 1.  Robotic assisted right Inguinal Hernia Repair 2.  Creation of right Posterior Rectus-Transversalis Fascia Advancment Flap for Coverage of Pelvic Wound (200 cm)  Surgeon:  Howie Ill, MD  Assistant:  Stanford Scotland, PA-S  Anesthesia:  General endotracheal  Estimated Blood Loss:  10 ml  Specimens:  None  Complications:  None  Indications for Procedure:  This is a 80 y.o. male who presents with a recurrent right inguinal hernia.  The options of surgery versus observation were reviewed with the patient and/or family. The risks of bleeding, abscess or infection, recurrence of symptoms, potential for an open procedure, injury to surrounding structures, and chronic pain were all discussed with the patient and was he willing to proceed.  We have planned this transabdominal procedure with the creation of right peritoneal flap based on the posterior rectus sheath and transversalis fascia in order to fully cover the mesh, creating a natural tisssue barrier for the bowel and peritoneal cavity.  Description of Procedure: The patient was correctly identified in the preoperative area and brought into the operating room.  The patient was placed supine with VTE prophylaxis in place.  Appropriate time-outs were performed.  Anesthesia was induced and the patient was intubated.  Foley catheter was placed.  Appropriate antibiotics were infused.  The abdomen was prepped and draped in a sterile fashion. A supraumbilical incision was made. A cutdown technique was used to enter the abdominal cavity without injury, and a Hasson trocar was inserted.  Pneumoperitoneum was obtained with appropriate opening pressures.  A Veress needle was used to start dissecting the peritoneal flap.  Two 8-mm robotic ports were placed in the right and left lateral  positions under direct visualization.  A large right 3D Max Mid Bard Mesh, a 2-0 Vicryl, and 2-0 vloc suture were placed through the umbilical port under direct visualization.  The [employer] platform was docked onto the patient, the camera was inserted and targeted, and the instruments were placed under direct visualization.  Both inguinal regions were inspected for hernias and it was confirmed that the patient had a right inguinal hernia.  Using electocautery, the peritoneal and posterior rectus tissue flap was created.  The peritoneum on the right side was scored from the median umbilical ligament laterally towards the ASIS.  The flap was mobilized using robotic scissors and the bipolar instruments, creating a plane along the posterior rectus sheath and transversalis fascia down to the pubic tubercle medially. It was then further mobilized laterally across the inguinal canal and femoral vessels and onto the psoas muscle. The inferior epigastric vessels were identified and preserved. This created a posterior rectus and peritoneal flap measuring roughly 17 cm x 12 cm.  The hernia sac and contents were reduced preserving all structures.  A right large Bard 3D Max Mid mesh was placed with good overlap along all the potential hernia defects and secured in place with 2-0 Vicryl along the medial superomedial and superolateral aspects.  Then, the peritoneal flap was advanced over the mesh and carried over to close the defect. A running 2-0 V lock suture was used to approximate the edge of the flap onto the peritoneum.  All needles were removed under direct visualization.  The 8- mm ports were removed under direct visualization and the Hasson trocar was removed.  The fascial opening was closed using 0 vicryl suture.  Local anesthetic  was infused in all incisions as well as a right ilioinguinal block, and the incisions were closed with 4-0 Monocryl.  The wounds were cleaned and sealed with DermaBond.  Foley catheter  was removed and the patient was emerged from anesthesia and extubated and brought to the recovery room for further management.  The patient tolerated the procedure well and all counts were correct at the end of the case.   Howie Ill, MD

## 2020-02-21 ENCOUNTER — Encounter: Payer: Self-pay | Admitting: Surgery

## 2020-02-21 NOTE — Telephone Encounter (Signed)
Error

## 2020-02-22 ENCOUNTER — Telehealth: Payer: Self-pay | Admitting: Surgery

## 2020-02-22 NOTE — Telephone Encounter (Signed)
Patient is calling and is asking if one of the nurses would give him a call patient said he is having some stomach issues and hasn't had a bowel movement and is a little concern, patient had surgery with Dr. Aleen Campi on 02/20/20, he had XI Robotic assisted inguinal hernia repair with mesh. Please call patient and avise.

## 2020-02-22 NOTE — Anesthesia Postprocedure Evaluation (Signed)
Anesthesia Post Note  Patient: PERICLES CARMICHEAL  Procedure(s) Performed: XI ROBOTIC ASSISTED INGUINAL HERNIA REPAIR WITH MESH, recurrent (Right )  Patient location during evaluation: PACU Anesthesia Type: General Level of consciousness: awake and alert Pain management: pain level controlled Vital Signs Assessment: post-procedure vital signs reviewed and stable Respiratory status: spontaneous breathing, nonlabored ventilation, respiratory function stable and patient connected to nasal cannula oxygen Cardiovascular status: blood pressure returned to baseline and stable Postop Assessment: no apparent nausea or vomiting Anesthetic complications: no   No complications documented.   Last Vitals:  Vitals:   02/20/20 1401 02/20/20 1433  BP: 116/69 127/69  Pulse: (!) 57 (!) 57  Resp: 17 18  Temp: 36.6 C   SpO2: 96% 98%    Last Pain:  Vitals:   02/21/20 0822  TempSrc:   PainSc: 3                  Yevette Edwards

## 2020-02-22 NOTE — Telephone Encounter (Signed)
Spoke with patient and notified him to try over the counter stool softener to help with constipation. Informed patient that he may be experiencing constipation due to the pain medication he was prescribed after surgery. Patient states he has been urinating more than normal, but denies having any pain with urination and denies any blood in urine. Patient verbalized understanding and has no further questions.

## 2020-02-23 ENCOUNTER — Telehealth: Payer: Self-pay

## 2020-02-23 NOTE — Telephone Encounter (Signed)
Attempted to call patient today 1/7 at 445pm, did not reach him. I left detailed voicemail  FYI - If patient calls back:  I am concerned that if this is a post-op issue related to urinary catheter, he may have developed a urinary tract infection.  He would likely need a urinalysis or dipstick urine test and urine culture. I would need to know if he has any other UTI symptoms burning, pain, fever, blood in urine etc.  Some of this could be related to the catheter itself.  I would recommend walk in clinic over weekend if persistent problem or worsening or infection symptoms  If he has new concern such as unable to avoid at all  - he should go to Emergency Department.  He may warrant Urology consultation following this catheter problem next week if it is not improved.  Saralyn Pilar, DO [facility] 02/23/2020, 5:01 PM

## 2020-02-23 NOTE — Telephone Encounter (Signed)
Copied from CRM [id number]. Topic: General - Other >> Feb 22, 2020  3:38 PM Marylen Ponto wrote: Reason for CRM: Pt stated he would like to make Dr. Kirtland Bouchard aware that he had surgery on Tuesday. Pt stated now that the catheter has been removed he has been having to use the restroom every 5-10 minutes. Pt stated he does not understand why he has to go so frequently because he is not consuming much to drink. Pt requests call back.

## 2020-02-26 NOTE — Telephone Encounter (Signed)
As per pt it was constipation after surgery but feeling much better now.

## 2020-03-05 ENCOUNTER — Other Ambulatory Visit: Payer: Self-pay

## 2020-03-05 ENCOUNTER — Encounter: Payer: Self-pay | Admitting: Physician Assistant

## 2020-03-05 ENCOUNTER — Ambulatory Visit (INDEPENDENT_AMBULATORY_CARE_PROVIDER_SITE_OTHER): Payer: Medicare HMO | Admitting: Physician Assistant

## 2020-03-05 ENCOUNTER — Encounter: Payer: Medicare HMO | Admitting: Physician Assistant

## 2020-03-05 VITALS — BP 133/85 | HR 72 | Temp 98.7°F | Ht 68.0 in | Wt 150.6 lb

## 2020-03-05 DIAGNOSIS — K4091 Unilateral inguinal hernia, without obstruction or gangrene, recurrent: Secondary | ICD-10-CM

## 2020-03-05 DIAGNOSIS — Z09 Encounter for follow-up examination after completed treatment for conditions other than malignant neoplasm: Secondary | ICD-10-CM

## 2020-03-05 NOTE — Patient Instructions (Addendum)
Patient to avoid any heavy lifting, bending, pushing or pulling of 20 pounds or more for a total of 6 weeks. Follow-up with our office as needed. Please call and ask to speak with a nurse if you develop questions or concerns.   GENERAL POST-OPERATIVE PATIENT INSTRUCTIONS   WOUND CARE INSTRUCTIONS:  Keep a dry clean dressing on the wound if there is drainage. The initial bandage may be removed after 24 hours.  Once the wound has quit draining you may leave it open to air.  If clothing rubs against the wound or causes irritation and the wound is not draining you may cover it with a dry dressing during the daytime.  Try to keep the wound dry and avoid ointments on the wound unless directed to do so.  If the wound becomes bright red and painful or starts to drain infected material that is not clear, please contact your physician immediately.  If the wound is mildly pink and has a thick firm ridge underneath it, this is normal, and is referred to as a healing ridge.  This will resolve over the next 4-6 weeks.  BATHING: You may shower if you have been informed of this by your surgeon. However, Please do not submerge in a tub, hot tub, or pool until incisions are completely sealed or have been told by your surgeon that you may do so.  DIET:  You may eat any foods that you can tolerate.  It is a good idea to eat a high fiber diet and take in plenty of fluids to prevent constipation.  If you do become constipated you may want to take a mild laxative or take ducolax tablets on a daily basis until your bowel habits are regular.  Constipation can be very uncomfortable, along with straining, after recent surgery.  ACTIVITY:  You are encouraged to cough and deep breath or use your incentive spirometer if you were given one, every 15-30 minutes when awake.  This will help prevent respiratory complications and low grade fevers post-operatively if you had a general anesthetic.  You may want to hug a pillow when  coughing and sneezing to add additional support to the surgical area, if you had abdominal or chest surgery, which will decrease pain during these times.  You are encouraged to walk and engage in light activity for the next two weeks.  You should not lift more than 20 pounds, until 4 to 6 weeks after surgery as it could put you at increased risk for complications.  Twenty pounds is roughly equivalent to a plastic bag of groceries. At that time- Listen to your body when lifting, if you have pain when lifting, stop and then try again in a few days. Soreness after doing exercises or activities of daily living is normal as you get back in to your normal routine.  MEDICATIONS:  Try to take narcotic medications and anti-inflammatory medications, such as tylenol, ibuprofen, naprosyn, etc., with food.  This will minimize stomach upset from the medication.  Should you develop nausea and vomiting from the pain medication, or develop a rash, please discontinue the medication and contact your physician.  You should not drive, make important decisions, or operate machinery when taking narcotic pain medication.  SUNBLOCK Use sun block to incision area over the next year if this area will be exposed to sun. This helps decrease scarring and will allow you avoid a permanent darkened area over your incision.  QUESTIONS:  Please feel free to call our office  if you have any questions, and we will be glad to assist you. [phone].

## 2020-03-05 NOTE — Progress Notes (Signed)
[facility] POST-OP OFFICE VISIT  03/05/2020  HPI: Kevin [name] is a 80 y.o. male 14 days s/p robotic assisted laparoscopic right inguinal hernia with Dr Aleen Campi  He is doing very well No issues with pain, nausea, emesis, fever, chills Incisions have healed well He has no further complaints, not needing any pain medications  Vital signs: BP 133/85   Pulse 72   Temp 98.7 F (37.1 C) (Oral)   Ht 5\' 8"  (1.727 m)   Wt 150 lb 9.6 oz (68.3 kg)   SpO2 98%   BMI 22.90 kg/m    Physical Exam: Constitutional: Well appearing male, NAD Abdomen: Soft, non-tender, non-distended, no rebound/guarding Skin: Laparoscopic incisions have healed well, no erythema or drainage   Assessment/Plan: This is a 80 y.o. male 14 days s/p robotic assisted laparoscopic right inguinal hernia   - Pain control prn  - reviewed lifting restrictions; recommend 6 weeks total  - Reviewed wound care  - He can rtc on as needed basis   -- 71, PA-C [facility] 03/05/2020, 11:54 AM [phone] M-F: 7am - 4pm

## 2020-03-29 DIAGNOSIS — H2513 Age-related nuclear cataract, bilateral: Secondary | ICD-10-CM | POA: Diagnosis not present

## 2020-03-29 DIAGNOSIS — H2512 Age-related nuclear cataract, left eye: Secondary | ICD-10-CM | POA: Diagnosis not present

## 2020-03-29 DIAGNOSIS — H25043 Posterior subcapsular polar age-related cataract, bilateral: Secondary | ICD-10-CM | POA: Diagnosis not present

## 2020-03-29 DIAGNOSIS — H18413 Arcus senilis, bilateral: Secondary | ICD-10-CM | POA: Diagnosis not present

## 2020-03-29 DIAGNOSIS — H40013 Open angle with borderline findings, low risk, bilateral: Secondary | ICD-10-CM | POA: Diagnosis not present

## 2020-04-02 ENCOUNTER — Telehealth: Payer: Self-pay

## 2020-04-02 DIAGNOSIS — B356 Tinea cruris: Secondary | ICD-10-CM

## 2020-04-02 MED ORDER — CICLOPIROX OLAMINE 0.77 % EX CREA: TOPICAL_CREAM | Freq: Two times a day (BID) | CUTANEOUS | 0 refills | Status: DC

## 2020-04-02 NOTE — Telephone Encounter (Signed)
Patient called requesting a cream to be sent in for his jock itch. He reports that it started up again about three days ago and it's bothering him to walk. Please advise... [facility] on [address] is his pharmacy.

## 2020-04-02 NOTE — Telephone Encounter (Signed)
Sent order for Ciclopirox cream to use twice a day for up to max 4 weeks.  Saralyn Pilar, DO [facility] 04/02/2020, 3:05 PM

## 2020-04-02 NOTE — Telephone Encounter (Signed)
Patient has been notified that the prescription has been sent. No further action is required at this time.

## 2020-04-12 ENCOUNTER — Telehealth: Payer: Self-pay

## 2020-04-12 NOTE — Telephone Encounter (Signed)
The pt called complaining of Intermittent tinnutis in Left ear x 3 days. He denies any other symptoms. He state he had this in the past and had to get his ear irrigated and the symptoms subsided. Appt scheduled for Wednesday, March 2nd.

## 2020-04-12 NOTE — Telephone Encounter (Signed)
Copied from CRM [id number]. Topic: Appointment Scheduling - Scheduling Inquiry for Clinic >> Apr 12, 2020 12:03 PM Randol Kern wrote: Reason for CRM: Pt called reporting left ear ringing, no pain, reports no other symptoms. Declined appt offers, wants to be contacted by office/message sent to PCP [phone]

## 2020-04-17 ENCOUNTER — Ambulatory Visit: Payer: Self-pay | Admitting: Family Medicine

## 2020-04-19 DIAGNOSIS — H2512 Age-related nuclear cataract, left eye: Secondary | ICD-10-CM | POA: Diagnosis not present

## 2020-04-19 DIAGNOSIS — H2511 Age-related nuclear cataract, right eye: Secondary | ICD-10-CM | POA: Diagnosis not present

## 2020-04-23 ENCOUNTER — Ambulatory Visit (INDEPENDENT_AMBULATORY_CARE_PROVIDER_SITE_OTHER): Payer: Medicare HMO | Admitting: Family Medicine

## 2020-04-23 ENCOUNTER — Other Ambulatory Visit: Payer: Self-pay | Admitting: Family Medicine

## 2020-04-23 ENCOUNTER — Other Ambulatory Visit: Payer: Self-pay

## 2020-04-23 ENCOUNTER — Encounter: Payer: Self-pay | Admitting: Family Medicine

## 2020-04-23 VITALS — BP 116/57 | HR 71 | Temp 97.7°F | Resp 18 | Ht 68.0 in | Wt 154.2 lb

## 2020-04-23 DIAGNOSIS — M159 Polyosteoarthritis, unspecified: Secondary | ICD-10-CM

## 2020-04-23 DIAGNOSIS — Z0000 Encounter for general adult medical examination without abnormal findings: Secondary | ICD-10-CM

## 2020-04-23 DIAGNOSIS — M8949 Other hypertrophic osteoarthropathy, multiple sites: Secondary | ICD-10-CM

## 2020-04-23 DIAGNOSIS — H9312 Tinnitus, left ear: Secondary | ICD-10-CM | POA: Diagnosis not present

## 2020-04-23 DIAGNOSIS — H6123 Impacted cerumen, bilateral: Secondary | ICD-10-CM

## 2020-04-23 DIAGNOSIS — R7309 Other abnormal glucose: Secondary | ICD-10-CM

## 2020-04-23 DIAGNOSIS — M150 Primary generalized (osteo)arthritis: Secondary | ICD-10-CM

## 2020-04-23 DIAGNOSIS — R634 Abnormal weight loss: Secondary | ICD-10-CM

## 2020-04-23 DIAGNOSIS — R972 Elevated prostate specific antigen [PSA]: Secondary | ICD-10-CM

## 2020-04-23 MED ORDER — GABAPENTIN 300 MG PO CAPS: 300.0000 mg | ORAL_CAPSULE | Freq: Every day | ORAL | 1 refills | Status: DC

## 2020-04-23 MED ORDER — NAPROXEN 500 MG PO TABS: 500.0000 mg | ORAL_TABLET | Freq: Two times a day (BID) | ORAL | 2 refills | Status: DC | PRN

## 2020-04-23 MED ORDER — DICLOFENAC SODIUM 1 % EX GEL: 2.0000 g | Freq: Four times a day (QID) | CUTANEOUS | 2 refills | Status: DC

## 2020-04-23 NOTE — Addendum Note (Signed)
Addended by: Smitty Cords on: 04/23/2020 05:45 PM   Modules accepted: Level of Service

## 2020-04-23 NOTE — Patient Instructions (Addendum)
Thank you for coming to the office today.  Use goodrx coupon if not covered for Diclofenac topical gel as needed  Recommend trial of Anti-inflammatory with Naproxen (Naprosyn) 500mg  tabs - take one with food and plenty of water TWICE daily every day (breakfast and dinner), for next 2 to 4 weeks, then you may take only as needed - DO NOT TAKE any ibuprofen, aleve, motrin while you are taking this medicine - It is safe to take Tylenol Ext Str 500mg  tabs - take 1 to 2 (max dose 1000mg ) every 6 hours as needed for breakthrough pain, max 24 hour daily dose is 6 to 8 tablets or 4000mg   Take Gabapentin 300mg  daily  You have thick impacted ear wax (cerumen) blocking ear canals and ear drums. This is the most likely cause of reduced hearing and ear pain and discomfort. - We were able to remove almost all of the ear wax with flushing in the office today  in the future if ear wax builds up again, over the counter Debrox (Carbamide peroxide), use on both sides following instructions on bottle, pharmacist will direct you to the appropriate ear drops if you need help. May take a week or more.  Avoid using Q-tips inside ears, as this can push wax deeper, but you can try to use rolled up kleenex as a wick to absorb fluid and wax as well.  If you are not making progress with ear wax removal at home, or the problem keeps coming back, please notify our office or return for re-evaluation, and we can discuss referral to ENT office for more formal ear wax removal.  DUE for FASTING BLOOD WORK (no food or drink after midnight before the lab appointment, only water or coffee without cream/sugar on the morning of)  SCHEDULE "Lab Only" visit in the morning at the clinic for lab draw in 4 MONTHS   - Make sure Lab Only appointment is at about 1 week before your next appointment, so that results will be available  For Lab Results, once available within 2-3 days of blood draw, you can can log in to MyChart online to view  your results and a brief explanation. Also, we can discuss results at next follow-up visit.   Please schedule a Follow-up Appointment to: Return in about 4 months (around 08/23/2020) for 4 month fasting lab only then 1 week later Annual Physical.  If you have any other questions or concerns, please feel free to call the office or send a message through MyChart. You may also schedule an earlier appointment if necessary.  Additionally, you may be receiving a survey about your experience at our office within a few days to 1 week by e-mail or mail. We value your feedback.  , DO [facility], 

## 2020-04-23 NOTE — Progress Notes (Signed)
Subjective:    Patient ID: Kevin [name], male    DOB: 25-Jul-1940, 80 y.o.   MRN: [id number]  Kevin [name] is a 80 y.o. male presenting on 04/23/2020 for Tinnitus (Persistent Lt ear ringing x 1 week. The pt state he had this to happen in the past and they had to irrigated the ear and the symptoms subsided. He denies pain  )   HPI   Bilateral Ear Wax Impaction Reports for past 1 week or more has had some fullness in ears and ringing in L ear worse. No pain in ears, in past required ear wax flushing here today for same issue.  Weight gain improved Reviewed updates since last visit, he reports has had hernia repair, and upcoming dental extraction.   Depression screen [facility] 2/9 04/23/2020 03/09/2019 12/27/2018  Decreased Interest 0 0 0  Down, Depressed, Hopeless 0 0 0  PHQ - 2 Score 0 0 0  Altered sleeping - - 0  Tired, decreased energy - - 0  Change in appetite - - 0  Feeling bad or failure about yourself  - - 0  Trouble concentrating - - 0  Moving slowly or fidgety/restless - - 0  Suicidal thoughts - - 0  PHQ-9 Score - - 0    Social History   Tobacco Use  . Smoking status: Former Smoker    Types: Pipe  . Smokeless tobacco: Never Used  Vaping Use  . Vaping Use: Never used  Substance Use Topics  . Alcohol use: Never    Alcohol/week: 0.0 standard drinks    Comment: rarely   . Drug use: No    Review of Systems Per HPI unless specifically indicated above     Objective:    BP (!) 116/57 (BP Location: Right Arm, Patient Position: Sitting, Cuff Size: Normal)   Pulse 71   Temp 97.7 F (36.5 C) (Temporal)   Resp 18   Ht '5\' 8"'  (1.727 m)   Wt 154 lb 3.2 oz (69.9 kg)   BMI 23.45 kg/m   Wt Readings from Last 3 Encounters:  04/23/20 154 lb 3.2 oz (69.9 kg)  03/05/20 150 lb 9.6 oz (68.3 kg)  02/20/20 152 lb 1.9 oz (69 kg)    Physical Exam Vitals and nursing note reviewed.  Constitutional:      General: He is not in acute distress.    Appearance: He is well-developed  and well-nourished. He is not diaphoretic.     Comments: Well-appearing, comfortable, cooperative  HENT:     Head: Normocephalic and atraumatic.     Right Ear: There is impacted cerumen.     Left Ear: There is impacted cerumen.     Mouth/Throat:     Mouth: Oropharynx is clear and moist.  Eyes:     General:        Right eye: No discharge.        Left eye: No discharge.     Conjunctiva/sclera: Conjunctivae normal.  Cardiovascular:     Rate and Rhythm: Normal rate.  Pulmonary:     Effort: Pulmonary effort is normal.  Musculoskeletal:        General: No edema.  Skin:    General: Skin is warm and dry.     Findings: No erythema or rash.  Neurological:     Mental Status: He is alert and oriented to person, place, and time.  Psychiatric:        Mood and Affect: Mood and affect normal.  Behavior: Behavior normal.     Comments: Well groomed, good eye contact, normal speech and thoughts     ________________________________________________________ PROCEDURE NOTE Date: 04/23/20 Bilateral Ear Lavage / Cerumen Removal Discussed benefits and risks (including pain / discomforts, dizziness, minor abrasion of ear canal). Verbal consent given by patient. Medication:  carbamide peroxide ear drops, Ear Lavage Solution (warm water + hydrogen peroxide) Performed by Donnie Mesa CMA / Dr Parks Ranger Several drops of carbamide peroxide placed in each ear, allowed to sit for few minutes. Ear lavage solution flushed into one ear at a time in attempt to dislodge and remove ear wax.   Repeat Ear Exam: Significant softening of ear wax with some removal but still deeper thick impacted cerumen.   Results for orders placed or performed during the hospital encounter of 02/16/20  SARS CORONAVIRUS 2 (TAT 6-24 HRS) Nasopharyngeal Nasopharyngeal Swab   Specimen: Nasopharyngeal Swab  Result Value Ref Range   SARS Coronavirus 2 NEGATIVE NEGATIVE      Assessment & Plan:   Problem List Items Addressed  This Visit   None   Visit Diagnoses    Bilateral impacted cerumen    -  Primary   Tinnitus of left ear          Significant amount of large thick impacted cerumen bilaterally ear, suspected primary cause of current tinnitus / fullness in ears - No prior hearing aids or hearing evaluation.  Plan: 1. Partially Successful office ear lavage cerumen removal today - however still has residual cerumen on re-evaluation - Use OTC Debrox wax kit Follow-up as needed - may refer to ENT if need  No orders of the defined types were placed in this encounter.     Follow up plan: Return if symptoms worsen or fail to improve.  Future labs  4 month  Nobie Putnam, DO [facility] 04/23/2020, 2:42 PM

## 2020-04-25 ENCOUNTER — Other Ambulatory Visit: Payer: Self-pay | Admitting: Family Medicine

## 2020-04-25 DIAGNOSIS — N529 Male erectile dysfunction, unspecified: Secondary | ICD-10-CM

## 2020-04-25 DIAGNOSIS — K219 Gastro-esophageal reflux disease without esophagitis: Secondary | ICD-10-CM

## 2020-04-25 DIAGNOSIS — R630 Anorexia: Secondary | ICD-10-CM

## 2020-04-26 ENCOUNTER — Other Ambulatory Visit: Payer: Self-pay | Admitting: Family Medicine

## 2020-04-26 DIAGNOSIS — N529 Male erectile dysfunction, unspecified: Secondary | ICD-10-CM

## 2020-05-17 DIAGNOSIS — H2511 Age-related nuclear cataract, right eye: Secondary | ICD-10-CM | POA: Diagnosis not present

## 2020-06-25 ENCOUNTER — Ambulatory Visit: Payer: Medicare HMO | Admitting: Family Medicine

## 2020-08-21 ENCOUNTER — Other Ambulatory Visit: Payer: Self-pay | Admitting: Family Medicine

## 2020-08-21 DIAGNOSIS — B356 Tinea cruris: Secondary | ICD-10-CM

## 2020-08-21 NOTE — Telephone Encounter (Signed)
Requested medication (s) are due for refill today: Yes  Requested medication (s) are on the active medication list: Yes  Last refill:  04/02/20  Future visit scheduled: Yes  Notes to clinic:  See request.    Requested Prescriptions  Pending Prescriptions Disp Refills   ciclopirox (LOPROX) 0.77 % cream [Pharmacy Med Name: CICLOPIROX 0.77% CREAM 15GM] 15 g 0    Sig: APPLY TOPICALLY TO THE AFFECTED AREA TWICE DAILY FOR UP TO 4 WEEKS. MAX      Off-Protocol Failed - 08/21/2020  2:29 PM      Failed - Medication not assigned to a protocol, review manually.      Passed - Valid encounter within last 12 months    Recent Outpatient Visits           4 months ago Bilateral impacted cerumen   [facility] Leroy, Netta Neat, DO   6 months ago Right inguinal hernia   [facility] Smitty Cords, [place]   11 months ago Coccyx pain   [facility] Fuig, Netta Neat, DO   1 year ago Osteonecrosis of left hip [facility])   [facility] Smitty Cords, DO   1 year ago Osteonecrosis of left hip [facility])   [facility] Smitty Cords, DO       Future Appointments             In 1 week Althea Charon, Netta Neat, DO [facility], [facility]

## 2020-08-23 ENCOUNTER — Encounter: Payer: Self-pay | Admitting: Family Medicine

## 2020-08-23 ENCOUNTER — Other Ambulatory Visit: Payer: Self-pay

## 2020-08-23 ENCOUNTER — Ambulatory Visit: Payer: Medicare HMO | Admitting: Family Medicine

## 2020-08-23 ENCOUNTER — Other Ambulatory Visit: Payer: Self-pay | Admitting: Family Medicine

## 2020-08-23 ENCOUNTER — Ambulatory Visit (INDEPENDENT_AMBULATORY_CARE_PROVIDER_SITE_OTHER): Payer: Medicare HMO | Admitting: Family Medicine

## 2020-08-23 VITALS — BP 125/68 | HR 64 | Ht 68.0 in | Wt 140.6 lb

## 2020-08-23 DIAGNOSIS — M8949 Other hypertrophic osteoarthropathy, multiple sites: Secondary | ICD-10-CM | POA: Diagnosis not present

## 2020-08-23 DIAGNOSIS — N529 Male erectile dysfunction, unspecified: Secondary | ICD-10-CM

## 2020-08-23 DIAGNOSIS — B356 Tinea cruris: Secondary | ICD-10-CM

## 2020-08-23 DIAGNOSIS — L309 Dermatitis, unspecified: Secondary | ICD-10-CM | POA: Diagnosis not present

## 2020-08-23 DIAGNOSIS — M159 Polyosteoarthritis, unspecified: Secondary | ICD-10-CM

## 2020-08-23 MED ORDER — TRIAMCINOLONE ACETONIDE 0.5 % EX CREA: 1.0000 "application " | TOPICAL_CREAM | Freq: Two times a day (BID) | CUTANEOUS | 5 refills | Status: DC

## 2020-08-23 MED ORDER — SILDENAFIL CITRATE 20 MG PO TABS
ORAL_TABLET | ORAL | 3 refills | Status: DC
Start: 2020-08-23 — End: 2021-04-23

## 2020-08-23 MED ORDER — IBUPROFEN 600 MG PO TABS: 600.0000 mg | ORAL_TABLET | Freq: Three times a day (TID) | ORAL | 1 refills | Status: DC | PRN

## 2020-08-23 MED ORDER — CICLOPIROX OLAMINE 0.77 % EX CREA
TOPICAL_CREAM | Freq: Two times a day (BID) | CUTANEOUS | 2 refills | Status: DC
Start: 2020-08-23 — End: 2022-04-07

## 2020-08-23 NOTE — Patient Instructions (Addendum)
Thank you for coming to the office today.  Refilled Eczema cream for dermatitis  Trial on the anti fungal for the jock itch  Ordered 600mg  ibuprofen as needed, use only when needed  Vitamin One A Day Mens 50+  Muscle milk   Please schedule a Follow-up Appointment to: Return if symptoms worsen or fail to improve.  If you have any other questions or concerns, please feel free to call the office or send a message through MyChart. You may also schedule an earlier appointment if necessary.  Additionally, you may be receiving a survey about your experience at our office within a few days to 1 week by e-mail or mail. We value your feedback.  , DO [facility], [facility]

## 2020-08-23 NOTE — Progress Notes (Signed)
Subjective:    Patient ID: Kevin [name], male    DOB: 27-Jun-1940, 80 y.o.   MRN: [id number]  Kevin [name] is a 80 y.o. male presenting on 08/23/2020 for Recurrent Skin Infections   HPI  Tinea cruris Previously on Ciclopirox topical PRN for jock itch was helpful in past Has same symptoms now with itching in groin  Updates on weight loss Teeth pulled recently, now 1st regular meal last month He has improved appetite, will take supplement as well  Eczema Rash Dry itchy rash skin spots on arms upper extremities Used triamcinolone in past with results, request refill  Arthritis, bilateral knees Chronic episodic pain, takes ibuprofen PRN, request a rx generic  Depression screen [facility] 2/9 04/23/2020 03/09/2019 12/27/2018  Decreased Interest 0 0 0  Down, Depressed, Hopeless 0 0 0  PHQ - 2 Score 0 0 0  Altered sleeping - - 0  Tired, decreased energy - - 0  Change in appetite - - 0  Feeling bad or failure about yourself  - - 0  Trouble concentrating - - 0  Moving slowly or fidgety/restless - - 0  Suicidal thoughts - - 0  PHQ-9 Score - - 0    Social History   Tobacco Use  . Smoking status: Former    Pack years: 0.00    Types: Pipe  . Smokeless tobacco: Never  Vaping Use  . Vaping Use: Never used  Substance Use Topics  . Alcohol use: Never    Alcohol/week: 0.0 standard drinks    Comment: rarely   . Drug use: No    Review of Systems Per HPI unless specifically indicated above     Objective:    BP 125/68   Pulse 64   Ht 5\' 8"  (1.727 m)   Wt 140 lb 9.6 oz (63.8 kg)   SpO2 100%   BMI 21.38 kg/m   Wt Readings from Last 3 Encounters:  08/23/20 140 lb 9.6 oz (63.8 kg)  04/23/20 154 lb 3.2 oz (69.9 kg)  03/05/20 150 lb 9.6 oz (68.3 kg)    Physical Exam Vitals and nursing note reviewed.  Constitutional:      General: He is not in acute distress.    Appearance: Normal appearance. He is well-developed. He is not diaphoretic.     Comments: Well-appearing,  comfortable, cooperative  HENT:     Head: Normocephalic and atraumatic.  Eyes:     General:        Right eye: No discharge.        Left eye: No discharge.     Conjunctiva/sclera: Conjunctivae normal.  Cardiovascular:     Rate and Rhythm: Normal rate.  Pulmonary:     Effort: Pulmonary effort is normal.  Skin:    General: Skin is warm and dry.     Findings: Rash (eczema deramtitis dry skin on upper extremity, area in groin with localized tinea rash) present. No erythema.  Neurological:     Mental Status: He is alert and oriented to person, place, and time.  Psychiatric:        Mood and Affect: Mood normal.        Behavior: Behavior normal.        Thought Content: Thought content normal.     Comments: Well groomed, good eye contact, normal speech and thoughts   Results for orders placed or performed during the hospital encounter of 02/16/20  SARS CORONAVIRUS 2 (TAT 6-24 HRS) Nasopharyngeal Nasopharyngeal Swab   Specimen: Nasopharyngeal  Swab  Result Value Ref Range   SARS Coronavirus 2 NEGATIVE NEGATIVE      Assessment & Plan:   Problem List Items Addressed This Visit     Primary osteoarthritis involving multiple joints   Relevant Medications   ibuprofen (ADVIL) 600 MG tablet   Other Visit Diagnoses     Eczema, unspecified type    -  Primary   Relevant Medications   triamcinolone cream (KENALOG) 0.5 %   Tinea cruris       Relevant Medications   ciclopirox (LOPROX) 0.77 % cream       Tinea cruris Area of itching in groin w/ rash Previously improved w/ med Will re order Ciclpirox today and follow-up  ECzema Chronic problem Upper ext flexural surface Re order Triamcinolone  Arthritis, knees Order rx Ibuprofen 600mg  PRN use, caution against overuse  Updates -   Meds ordered this encounter  Medications  . ciclopirox (LOPROX) 0.77 % cream    Sig: Apply topically 2 (two) times daily.    Dispense:  15 g    Refill:  2  . triamcinolone cream (KENALOG) 0.5 %     Sig: Apply 1 application topically 2 (two) times daily. To affected areas, for up to 2 weeks.    Dispense:  30 g    Refill:  5  . ibuprofen (ADVIL) 600 MG tablet    Sig: Take 1 tablet (600 mg total) by mouth every 8 (eight) hours as needed.    Dispense:  90 tablet    Refill:  1      Follow up plan: Return if symptoms worsen or fail to improve.  , DO [facility] 08/23/2020, 10:57 AM

## 2020-08-27 ENCOUNTER — Ambulatory Visit: Payer: Medicare HMO | Admitting: Family Medicine

## 2020-08-27 ENCOUNTER — Other Ambulatory Visit: Payer: Medicare HMO

## 2020-08-28 ENCOUNTER — Ambulatory Visit: Payer: Medicare HMO | Admitting: Family Medicine

## 2020-08-30 ENCOUNTER — Ambulatory Visit: Payer: Medicare HMO | Admitting: Family Medicine

## 2020-09-03 ENCOUNTER — Encounter: Payer: Medicare HMO | Admitting: Family Medicine

## 2020-09-10 ENCOUNTER — Telehealth: Payer: Self-pay | Admitting: Family Medicine

## 2020-09-10 NOTE — Telephone Encounter (Signed)
Copied from CRM [id number]. Topic: Medicare AWV >> Sep 10, 2020 11:36 AM Claudette Laws R wrote: Reason for CRM:  Left message for patient to call back and schedule Medicare Annual Wellness Visit (AWV) to be done virtually or by telephone.  No hx of AWV eligible as of 02/16/2009 awvi  Please schedule at anytime with [facility]      40 Minutes appointment   Any questions, please call me at [phone]

## 2020-09-26 ENCOUNTER — Other Ambulatory Visit: Payer: Self-pay

## 2020-09-26 DIAGNOSIS — R7309 Other abnormal glucose: Secondary | ICD-10-CM

## 2020-09-26 DIAGNOSIS — M8949 Other hypertrophic osteoarthropathy, multiple sites: Secondary | ICD-10-CM

## 2020-09-26 DIAGNOSIS — Z0000 Encounter for general adult medical examination without abnormal findings: Secondary | ICD-10-CM

## 2020-09-26 DIAGNOSIS — R972 Elevated prostate specific antigen [PSA]: Secondary | ICD-10-CM

## 2020-09-26 DIAGNOSIS — M159 Polyosteoarthritis, unspecified: Secondary | ICD-10-CM

## 2020-09-26 DIAGNOSIS — R634 Abnormal weight loss: Secondary | ICD-10-CM

## 2020-09-27 ENCOUNTER — Other Ambulatory Visit: Payer: Medicare HMO

## 2020-09-30 DIAGNOSIS — U071 COVID-19: Secondary | ICD-10-CM | POA: Diagnosis not present

## 2020-09-30 DIAGNOSIS — J029 Acute pharyngitis, unspecified: Secondary | ICD-10-CM | POA: Diagnosis not present

## 2020-09-30 DIAGNOSIS — R059 Cough, unspecified: Secondary | ICD-10-CM | POA: Diagnosis not present

## 2020-09-30 DIAGNOSIS — Z791 Long term (current) use of non-steroidal anti-inflammatories (NSAID): Secondary | ICD-10-CM | POA: Diagnosis not present

## 2020-09-30 DIAGNOSIS — Z20822 Contact with and (suspected) exposure to covid-19: Secondary | ICD-10-CM | POA: Diagnosis not present

## 2020-09-30 DIAGNOSIS — Z87891 Personal history of nicotine dependence: Secondary | ICD-10-CM | POA: Diagnosis not present

## 2020-10-04 ENCOUNTER — Encounter: Payer: Medicare HMO | Admitting: Family Medicine

## 2020-10-22 ENCOUNTER — Other Ambulatory Visit: Payer: Medicare HMO

## 2020-11-12 ENCOUNTER — Ambulatory Visit: Payer: Medicare HMO

## 2020-11-12 ENCOUNTER — Telehealth: Payer: Self-pay

## 2020-11-12 NOTE — Telephone Encounter (Signed)
This nurse called patient in regards to telephonic AWV. He stated that he wanted to reschedule. Rescheduled for 11/26/2020.

## 2020-11-26 ENCOUNTER — Telehealth: Payer: Self-pay

## 2020-11-26 ENCOUNTER — Ambulatory Visit: Payer: Medicare HMO

## 2020-11-26 NOTE — Telephone Encounter (Signed)
This nurse called patient for our scheduled telephonic AWV. He said he needed to reschedule it. Rescheduled to 12/03/2020.

## 2020-12-02 DIAGNOSIS — Z008 Encounter for other general examination: Secondary | ICD-10-CM | POA: Diagnosis not present

## 2020-12-02 DIAGNOSIS — Z7722 Contact with and (suspected) exposure to environmental tobacco smoke (acute) (chronic): Secondary | ICD-10-CM | POA: Diagnosis not present

## 2020-12-02 DIAGNOSIS — M1990 Unspecified osteoarthritis, unspecified site: Secondary | ICD-10-CM | POA: Diagnosis not present

## 2020-12-02 DIAGNOSIS — Z809 Family history of malignant neoplasm, unspecified: Secondary | ICD-10-CM | POA: Diagnosis not present

## 2020-12-02 DIAGNOSIS — Z791 Long term (current) use of non-steroidal anti-inflammatories (NSAID): Secondary | ICD-10-CM | POA: Diagnosis not present

## 2020-12-02 DIAGNOSIS — R030 Elevated blood-pressure reading, without diagnosis of hypertension: Secondary | ICD-10-CM | POA: Diagnosis not present

## 2020-12-02 DIAGNOSIS — K219 Gastro-esophageal reflux disease without esophagitis: Secondary | ICD-10-CM | POA: Diagnosis not present

## 2020-12-02 DIAGNOSIS — G8929 Other chronic pain: Secondary | ICD-10-CM | POA: Diagnosis not present

## 2020-12-02 DIAGNOSIS — Z8249 Family history of ischemic heart disease and other diseases of the circulatory system: Secondary | ICD-10-CM | POA: Diagnosis not present

## 2020-12-02 DIAGNOSIS — N529 Male erectile dysfunction, unspecified: Secondary | ICD-10-CM | POA: Diagnosis not present

## 2020-12-02 DIAGNOSIS — Z87891 Personal history of nicotine dependence: Secondary | ICD-10-CM | POA: Diagnosis not present

## 2020-12-03 ENCOUNTER — Ambulatory Visit: Payer: Medicare HMO

## 2020-12-03 ENCOUNTER — Telehealth: Payer: Self-pay

## 2020-12-03 NOTE — Telephone Encounter (Signed)
This nurse called patient for scheduled telephonic AWV. Patient wanted to reschedule again. Rescheduled for 12/17/2020.

## 2020-12-12 ENCOUNTER — Telehealth: Payer: Self-pay | Admitting: Surgery

## 2020-12-12 ENCOUNTER — Ambulatory Visit: Payer: Self-pay | Admitting: *Deleted

## 2020-12-12 NOTE — Telephone Encounter (Signed)
Patient is calling and is asking if one of the nurses would give him a call patient is complaining of left side pain. Please call patient and adivse.

## 2020-12-12 NOTE — Telephone Encounter (Signed)
Patient is calling with pain at surgical incision site from laparoscopic surgery in January. Patient reports he feels a slight bulge in the area. Advised UC for evaluation. Patient states he is going to call surgeon first and will go to UC if can not be seen. Patient will follow up in office Tuesday.

## 2020-12-12 NOTE — Telephone Encounter (Signed)
Reason for Disposition  [1] MILD pain (e.g., does not interfere with normal activities) AND [2] pain comes and goes (cramps) [3] present > 48 hours  (Exception: this same abdominal pain is a chronic symptom recurrent or ongoing AND present > 4 weeks)  Answer Assessment - Initial Assessment Questions 1. LOCATION: "Where does it hurt?"      Left side at previous surgical site 2. RADIATION: "Does the pain shoot anywhere else?" (e.g., chest, back)     No radiation 3. ONSET: "When did the pain begin?" (Minutes, hours or days ago)      Several days ago- knot or bulge 4. SUDDEN: "Gradual or sudden onset?"     sudden 5. PATTERN "Does the pain come and go, or is it constant?"    - If constant: "Is it getting better, staying the same, or worsening?"      (Note: Constant means the pain never goes away completely; most serious pain is constant and it progresses)     - If intermittent: "How long does it last?" "Do you have pain now?"     (Note: Intermittent means the pain goes away completely between bouts)     Comes and goes 6. SEVERITY: "How bad is the pain?"  (e.g., Scale 1-10; mild, moderate, or severe)    - MILD (1-3): doesn't interfere with normal activities, abdomen soft and not tender to touch     - MODERATE (4-7): interferes with normal activities or awakens from sleep, abdomen tender to touch     - SEVERE (8-10): excruciating pain, doubled over, unable to do any normal activities       Mild/moderate 7. RECURRENT SYMPTOM: "Have you ever had this type of stomach pain before?" If Yes, ask: "When was the last time?" and "What happened that time?"      Patient has hernia removed at the site years ago 8. CAUSE: "What do you think is causing the stomach pain?"     another hernia- ? 9. RELIEVING/AGGRAVATING FACTORS: "What makes it better or worse?" (e.g., movement, antacids, bowel movement)     no 10. OTHER SYMPTOMS: "Do you have any other symptoms?" (e.g., back pain, diarrhea, fever, urination  pain, vomiting)       Back pain- started with abdominal pain  Protocols used: Abdominal Pain - Male-A-AH

## 2020-12-12 NOTE — Telephone Encounter (Signed)
Right Inguinal hernia repair with mesh 02/20/2020-patient states his discomfort began several days ago. He has a bulge on the left groin side.  Denies difficulty with bowel movements. Denies difficulty with urination. Denies fever,chills,nausea or vomiting. Patient requesting to be seen as soon as schedule permits.

## 2020-12-13 ENCOUNTER — Other Ambulatory Visit: Payer: Self-pay

## 2020-12-13 ENCOUNTER — Encounter: Payer: Self-pay | Admitting: Surgery

## 2020-12-13 ENCOUNTER — Ambulatory Visit (INDEPENDENT_AMBULATORY_CARE_PROVIDER_SITE_OTHER): Payer: Medicare HMO | Admitting: Surgery

## 2020-12-13 VITALS — BP 145/87 | HR 70 | Temp 98.2°F | Ht 68.0 in | Wt 147.8 lb

## 2020-12-13 DIAGNOSIS — R1032 Left lower quadrant pain: Secondary | ICD-10-CM

## 2020-12-13 NOTE — Patient Instructions (Addendum)
  Please call our office if you have any questions or concerns.  If you notice a bulge, increase in pain let us know.

## 2020-12-13 NOTE — Progress Notes (Signed)
12/13/2020  History of Present Illness: Kevin [name] is a 80 y.o. male with a history of an open left inguinal hernia repair in 2006 presenting today with reports of a different sensation in his left groin.  Patient also has a history of an open right inguinal hernia repair in 1990 and a robotic right inguinal hernia repair by me on 02/20/2020 for recurrent right inguinal hernia.  Patient reports that recently he is noted a different sensation particular when he is standing up in the left groin.  He denies any specific bulging or significant pain but he just feels the area is different he wanted to be checked out as a precaution.  Denies any issues with the right groin.  Denies any trouble urinating with bowel function.  Past Medical History: Past Medical History:  Diagnosis Date   Arthritis    GERD (gastroesophageal reflux disease)      Past Surgical History: Past Surgical History:  Procedure Laterality Date   COLONOSCOPY WITH PROPOFOL N/A 06/05/2015   Procedure: COLONOSCOPY WITH PROPOFOL;  Surgeon: Kieth Brightly, MD;  Location: [facility] ENDOSCOPY;  Service: Endoscopy;  Laterality: N/A;   HERNIA REPAIR Left 03/24/2004   inguinal    HERNIA REPAIR Right 1990   inguinal   XI ROBOTIC ASSISTED INGUINAL HERNIA REPAIR WITH MESH Right 02/20/2020   Procedure: XI ROBOTIC ASSISTED INGUINAL HERNIA REPAIR WITH MESH, recurrent;  Surgeon: Henrene Dodge, MD;  Location: [facility] ORS;  Service: General;  Laterality: Right;    Home Medications: Prior to Admission medications   Medication Sig Start Date End Date Taking? Authorizing Provider  ciclopirox (LOPROX) 0.77 % cream Apply topically 2 (two) times daily. 08/23/20  Yes Karamalegos, Netta Neat, DO  diclofenac Sodium (VOLTAREN) 1 % GEL Apply 2 g topically 4 (four) times daily. 04/23/20  Yes Karamalegos, Netta Neat, DO  ibuprofen (ADVIL) 600 MG tablet Take 1 tablet (600 mg total) by mouth every 8 (eight) hours as needed. 08/23/20  Yes Karamalegos, Netta Neat, DO  omeprazole (PRILOSEC) 20 MG capsule TAKE 1 CAPSULE(20 MG) BY MOUTH DAILY BEFORE BREAKFAST AS NEEDED 04/25/20  Yes Karamalegos, Netta Neat, DO  sildenafil (REVATIO) 20 MG tablet Use 1-2 pills daily as needed before sex. 08/23/20  Yes Karamalegos, Netta Neat, DO  triamcinolone cream (KENALOG) 0.5 % Apply 1 application topically 2 (two) times daily. To affected areas, for up to 2 weeks. 08/23/20  Yes Karamalegos, Netta Neat, DO  BESIVANCE 0.6 % SUSP Place 1 drop into the right eye 3 (three) times daily. 04/19/20   [provider]    Allergies: Allergies  Allergen Reactions   Tomato Itching   Shellfish Allergy Itching and Rash    Review of Systems: Review of Systems  Constitutional:  Negative for chills and fever.  Respiratory:  Negative for shortness of breath.   Cardiovascular:  Negative for chest pain.  Gastrointestinal:  Negative for abdominal pain, nausea and vomiting.   Physical Exam BP (!) 145/87   Pulse 70   Temp 98.2 F (36.8 C) (Oral)   Ht 5\' 8"  (1.727 m)   Wt 147 lb 12.8 oz (67 kg)   SpO2 99%   BMI 22.47 kg/m  CONSTITUTIONAL: No acute distress HEENT:  Normocephalic, atraumatic, extraocular motion intact. RESPIRATORY:  Normal respiratory effort without pathologic use of accessory muscles. CARDIOVASCULAR: Regular rhythm and rate. GI: The abdomen is soft, nondistended, nontender to palpation.  Patient's incisions are well-healed.  On the left groin, the patient has a 1 cm area of  firmness in the lateral aspect of the open inguinal hernia repair incision.  I think this is consistent with scar tissue.  On coughing or straining, there is no herniation or recurrence palpable.  No issues on the right groin with no evidence of recurrence.   NEUROLOGIC:  Motor and sensation is grossly normal.  Cranial nerves are grossly intact. PSYCH:  Alert and oriented to person, place and time. Affect is normal.  Assessment and Plan: This is a 80 y.o. male with left groin  discomfort.  - Discussed with the patient at this point I do not feel a true recurrence of his left inguinal hernia.  I am unable to palpate a defect or any recurrence.  I think is likely related to scar tissue.  I offered him getting an ultrasound but he declined and I think that is reasonable given that I do not have a true hernia palpable on exam.  Discussed with him that we can continue watching this area and if he feels that this is getting bigger or that there is anything bulging or any discomfort or pain, that he can call us so that we can reevaluate him and potentially get an ultrasound at that point.  He is in agreement with this plan and all of his questions have been answered.  I spent 15 minutes dedicated to the care of this patient on the date of this encounter to include pre-visit review of records, face-to-face time with the patient discussing diagnosis and management, and any post-visit coordination of care.   Howie Ill, MD [facility]

## 2020-12-17 ENCOUNTER — Ambulatory Visit: Payer: Medicare HMO

## 2020-12-17 ENCOUNTER — Telehealth: Payer: Self-pay

## 2020-12-17 ENCOUNTER — Ambulatory Visit: Payer: Medicare HMO | Admitting: Internal Medicine

## 2020-12-17 NOTE — Telephone Encounter (Signed)
This nurse called patient for telephonic AWV. He said he needed a time later in the day. Rescheduled to 01/17/2021.

## 2021-01-17 ENCOUNTER — Other Ambulatory Visit: Payer: Self-pay

## 2021-01-17 ENCOUNTER — Ambulatory Visit (INDEPENDENT_AMBULATORY_CARE_PROVIDER_SITE_OTHER): Payer: Medicare HMO

## 2021-01-17 VITALS — BP 124/74 | HR 59 | Temp 98.0°F | Ht 68.0 in | Wt 147.2 lb

## 2021-01-17 DIAGNOSIS — Z0000 Encounter for general adult medical examination without abnormal findings: Secondary | ICD-10-CM

## 2021-01-17 NOTE — Patient Instructions (Signed)
Kevin [name] , Thank you for taking time to come for your Medicare Wellness Visit. I appreciate your ongoing commitment to your health goals. Please review the following plan we discussed and let me know if I can assist you in the future.   Screening recommendations/referrals: Colonoscopy: 06/05/15 Recommended yearly ophthalmology/optometry visit for glaucoma screening and checkup Recommended yearly dental visit for hygiene and checkup  Vaccinations: Influenza vaccine: declined Pneumococcal vaccine: n/d Tdap vaccine: n/d Shingles vaccine: n/d   Covid-19: 05/01/19  Advanced directives:   Conditions/risks identified:   Next appointment: Follow up in one year for your annual wellness visit.   Preventive Care 79 Years and Older, Male Preventive care refers to lifestyle choices and visits with your health care provider that can promote health and wellness. What does preventive care include? A yearly physical exam. This is also called an annual well check. Dental exams once or twice a year. Routine eye exams. Ask your health care provider how often you should have your eyes checked. Personal lifestyle choices, including: Daily care of your teeth and gums. Regular physical activity. Eating a healthy diet. Avoiding tobacco and drug use. Limiting alcohol use. Practicing safe sex. Taking low doses of aspirin every day. Taking vitamin and mineral supplements as recommended by your health care provider. What happens during an annual well check? The services and screenings done by your health care provider during your annual well check will depend on your age, overall health, lifestyle risk factors, and family history of disease. Counseling  Your health care provider may ask you questions about your: Alcohol use. Tobacco use. Drug use. Emotional well-being. Home and relationship well-being. Sexual activity. Eating habits. History of falls. Memory and ability to understand  (cognition). Work and work Astronomer. Screening  You may have the following tests or measurements: Height, weight, and BMI. Blood pressure. Lipid and cholesterol levels. These may be checked every 5 years, or more frequently if you are over 13 years old. Skin check. Lung cancer screening. You may have this screening every year starting at age 32 if you have a 30-pack-year history of smoking and currently smoke or have quit within the past 15 years. Fecal occult blood test (FOBT) of the stool. You may have this test every year starting at age 52. Flexible sigmoidoscopy or colonoscopy. You may have a sigmoidoscopy every 5 years or a colonoscopy every 10 years starting at age 79. Prostate cancer screening. Recommendations will vary depending on your family history and other risks. Hepatitis C blood test. Hepatitis B blood test. Sexually transmitted disease (STD) testing. Diabetes screening. This is done by checking your blood sugar (glucose) after you have not eaten for a while (fasting). You may have this done every 1-3 years. Abdominal aortic aneurysm (AAA) screening. You may need this if you are a current or former smoker. Osteoporosis. You may be screened starting at age 49 if you are at high risk. Talk with your health care provider about your test results, treatment options, and if necessary, the need for more tests. Vaccines  Your health care provider may recommend certain vaccines, such as: Influenza vaccine. This is recommended every year. Tetanus, diphtheria, and acellular pertussis (Tdap, Td) vaccine. You may need a Td booster every 10 years. Zoster vaccine. You may need this after age 60. Pneumococcal 13-valent conjugate (PCV13) vaccine. One dose is recommended after age 40. Pneumococcal polysaccharide (PPSV23) vaccine. One dose is recommended after age 58. Talk to your health care provider about which screenings and vaccines you  need and how often you need them. This  information is not intended to replace advice given to you by your health care provider. Make sure you discuss any questions you have with your health care provider. Document Released: 03/01/2015 Document Revised: 10/23/2015 Document Reviewed: 12/04/2014 Elsevier Interactive Patient Education  2017 [place] Prevention in the Home Falls can cause injuries. They can happen to people of all ages. There are many things you can do to make your home safe and to help prevent falls. What can I do on the outside of my home? Regularly fix the edges of walkways and driveways and fix any cracks. Remove anything that might make you trip as you walk through a door, such as a raised step or threshold. Trim any bushes or trees on the path to your home. Use bright outdoor lighting. Clear any walking paths of anything that might make someone trip, such as rocks or tools. Regularly check to see if handrails are loose or broken. Make sure that both sides of any steps have handrails. Any raised decks and porches should have guardrails on the edges. Have any leaves, snow, or ice cleared regularly. Use sand or salt on walking paths during winter. Clean up any spills in your garage right away. This includes oil or grease spills. What can I do in the bathroom? Use night lights. Install grab bars by the toilet and in the tub and shower. Do not use towel bars as grab bars. Use non-skid mats or decals in the tub or shower. If you need to sit down in the shower, use a plastic, non-slip stool. Keep the floor dry. Clean up any water that spills on the floor as soon as it happens. Remove soap buildup in the tub or shower regularly. Attach bath mats securely with double-sided non-slip rug tape. Do not have throw rugs and other things on the floor that can make you trip. What can I do in the bedroom? Use night lights. Make sure that you have a light by your bed that is easy to reach. Do not use any sheets or  blankets that are too big for your bed. They should not hang down onto the floor. Have a firm chair that has side arms. You can use this for support while you get dressed. Do not have throw rugs and other things on the floor that can make you trip. What can I do in the kitchen? Clean up any spills right away. Avoid walking on wet floors. Keep items that you use a lot in easy-to-reach places. If you need to reach something above you, use a strong step stool that has a grab bar. Keep electrical cords out of the way. Do not use floor polish or wax that makes floors slippery. If you must use wax, use non-skid floor wax. Do not have throw rugs and other things on the floor that can make you trip. What can I do with my stairs? Do not leave any items on the stairs. Make sure that there are handrails on both sides of the stairs and use them. Fix handrails that are broken or loose. Make sure that handrails are as long as the stairways. Check any carpeting to make sure that it is firmly attached to the stairs. Fix any carpet that is loose or worn. Avoid having throw rugs at the top or bottom of the stairs. If you do have throw rugs, attach them to the floor with carpet tape. Make sure that  you have a light switch at the top of the stairs and the bottom of the stairs. If you do not have them, ask someone to add them for you. What else can I do to help prevent falls? Wear shoes that: Do not have high heels. Have rubber bottoms. Are comfortable and fit you well. Are closed at the toe. Do not wear sandals. If you use a stepladder: Make sure that it is fully opened. Do not climb a closed stepladder. Make sure that both sides of the stepladder are locked into place. Ask someone to hold it for you, if possible. Clearly mark and make sure that you can see: Any grab bars or handrails. First and last steps. Where the edge of each step is. Use tools that help you move around (mobility aids) if they are  needed. These include: Canes. Walkers. Scooters. Crutches. Turn on the lights when you go into a dark area. Replace any light bulbs as soon as they burn out. Set up your furniture so you have a clear path. Avoid moving your furniture around. If any of your floors are uneven, fix them. If there are any pets around you, be aware of where they are. Review your medicines with your doctor. Some medicines can make you feel dizzy. This can increase your chance of falling. Ask your doctor what other things that you can do to help prevent falls. This information is not intended to replace advice given to you by your health care provider. Make sure you discuss any questions you have with your health care provider. Document Released: 11/29/2008 Document Revised: 07/11/2015 Document Reviewed: 03/09/2014 Elsevier Interactive Patient Education  2017 [employer].

## 2021-01-17 NOTE — Progress Notes (Signed)
Subjective:   [name] is a 80 y.o. male who presents for Medicare Annual/Subsequent preventive examination.  Review of Systems     Cardiac Risk Factors include: advanced age (>32men, >30 women);male gender     Objective:    Today's Vitals   01/17/21 1525  BP: 124/74  Pulse: (!) 59  Temp: 98 F (36.7 C)  TempSrc: Oral  SpO2: 100%  Weight: 147 lb 3.2 oz (66.8 kg)  Height:  (1.727 m)   Body mass index is 22.38 kg/m.  Advanced Directives 01/17/2021 02/19/2020 05/12/2016  Does Patient Have a Medical Advance Directive? No No No  Would patient like information on creating a medical advance directive? No - Patient declined No - Patient declined No - Patient declined    Current Medications (verified) Outpatient Encounter Medications as of 01/17/2021  Medication Sig   diclofenac Sodium (VOLTAREN) 1 % GEL Apply 2 g topically 4 (four) times daily.   sildenafil (REVATIO) 20 MG tablet Use 1-2 pills daily as needed before sex.   triamcinolone cream (KENALOG) 0.5 % Apply 1 application topically 2 (two) times daily. To affected areas, for up to 2 weeks.   BESIVANCE 0.6 % SUSP Place 1 drop into the right eye 3 (three) times daily. (Patient not taking: Reported on 01/17/2021)   ciclopirox (LOPROX) 0.77 % cream Apply topically 2 (two) times daily. (Patient not taking: Reported on 01/17/2021)   ibuprofen (ADVIL) 600 MG tablet Take 1 tablet (600 mg total) by mouth every 8 (eight) hours as needed. (Patient not taking: Reported on 01/17/2021)   omeprazole (PRILOSEC) 20 MG capsule TAKE 1 CAPSULE(20 MG) BY MOUTH DAILY BEFORE BREAKFAST AS NEEDED (Patient not taking: Reported on 01/17/2021)   No facility-administered encounter medications on file as of 01/17/2021.    Allergies (verified) Tomato and Shellfish allergy   History: Past Medical History:  Diagnosis Date   Arthritis    GERD (gastroesophageal reflux disease)    Past Surgical History:  Procedure Laterality Date   COLONOSCOPY  WITH PROPOFOL N/A 06/05/2015   Procedure: COLONOSCOPY WITH PROPOFOL;  Surgeon: Kieth Brightly, MD;  Location: [facility] ENDOSCOPY;  Service: Endoscopy;  Laterality: N/A;   HERNIA REPAIR Left 03/24/2004   inguinal    HERNIA REPAIR Right 1990   inguinal   XI ROBOTIC ASSISTED INGUINAL HERNIA REPAIR WITH MESH Right 02/20/2020   Procedure: XI ROBOTIC ASSISTED INGUINAL HERNIA REPAIR WITH MESH, recurrent;  Surgeon: Henrene Dodge, MD;  Location: [facility] ORS;  Service: General;  Laterality: Right;   Family History  Problem Relation Age of Onset   Ovarian cancer Mother    Alcohol abuse Father    Social History   Socioeconomic History   Marital status: Married    Spouse name: Bonita Quin   Number of children: 2   Years of education: Not on file   Highest education level: Not on file  Occupational History   Not on file  Tobacco Use   Smoking status: Former   Smokeless tobacco: Never  Vaping Use   Vaping Use: Never used  Substance and Sexual Activity   Alcohol use: Never    Alcohol/week: 0.0 standard drinks    Comment: rarely    Drug use: No   Sexual activity: Not on file  Other Topics Concern   Not on file  Social History Narrative   Not on file   Social Determinants of Health   Financial Resource Strain: Low Risk    Difficulty of Paying Living Expenses: Not hard  at all  Food Insecurity: No Food Insecurity   Worried About Programme researcher, broadcasting/film/video in the Last Year: Never true   Ran Out of Food in the Last Year: Never true  Transportation Needs: No Transportation Needs   Lack of Transportation (Medical): No   Lack of Transportation (Non-Medical): No  Physical Activity: Insufficiently Active   Days of Exercise per Week: 2 days   Minutes of Exercise per Session: 10 min  Stress: No Stress Concern Present   Feeling of Stress : Not at all  Social Connections: Moderately Integrated   Frequency of Communication with Friends and Family: Three times a week   Frequency of Social Gatherings with  Friends and Family: Once a week   Attends Religious Services: More than 4 times per year   Active Member of [employer] or Organizations: No   Attends Engineer, structural: Never   Marital Status: Married    Tobacco Counseling Counseling given: Not Answered   Clinical Intake:  Pre-visit preparation completed: Yes  Pain : No/denies pain     Nutritional Risks: None Diabetes: No    Interpreter Needed?: No  Information entered by :: Kennedy Bucker, LPN   Activities of Daily Living In your present state of health, do you have any difficulty performing the following activities: 01/17/2021 02/19/2020  Hearing? N N  Vision? N N  Difficulty concentrating or making decisions? N N  Walking or climbing stairs? N N  Dressing or bathing? N N  Doing errands, shopping? N N  Preparing Food and eating ? N -  Using the Toilet? N -  In the past six months, have you accidently leaked urine? N -  Do you have problems with loss of bowel control? N -  Managing your Medications? N -  Managing your Finances? N -  Housekeeping or managing your Housekeeping? N -  Some recent data might be hidden    Patient Care Team: Smitty Cords, DO as PCP - General (Family Medicine) Derwood Kaplan, MD (Internal Medicine) Kieth Brightly, MD (General Surgery)  Indicate any recent Medical Services you may have received from other than [facility] providers in the past year (date may be approximate).     Assessment:   This is a routine wellness examination for Kevin [name].  Hearing/Vision screen No results found.  Dietary issues and exercise activities discussed: Current Exercise Habits: The patient does not participate in regular exercise at present, Exercise limited by: None identified   Goals Addressed             This Visit's Progress    DIET - EAT MORE FRUITS AND VEGETABLES         Depression Screen PHQ 2/9 Scores 01/17/2021 01/17/2021 04/23/2020 03/09/2019 12/27/2018 09/28/2018  08/25/2018  PHQ - 2 Score 0 0 0 0 0 0 0  PHQ- 9 Score - - - - 0 - -    Fall Risk Fall Risk  01/17/2021 12/13/2020 02/14/2020 03/09/2019 12/27/2018  Falls in the past year? 0 0 1 0 0  Number falls in past yr: 0 - 0 0 -  Injury with Fall? 0 - 0 0 -  Risk for fall due to : No Fall Risks - - - -  Follow up Falls prevention discussed - - Falls evaluation completed -    FALL RISK PREVENTION PERTAINING TO THE HOME:  Any stairs in or around the home? No  If so, are there any without handrails? No  Home free of loose  throw rugs in walkways, pet beds, electrical cords, etc? Yes  Adequate lighting in your home to reduce risk of falls? Yes   ASSISTIVE DEVICES UTILIZED TO PREVENT FALLS:  Life alert? No  Use of a cane, walker or w/c? No  Grab bars in the bathroom? No  Shower chair or bench in shower? No  Elevated toilet seat or a handicapped toilet? No   TIMED UP AND GO:  Was the test performed? Yes .  Length of time to ambulate 10 feet: 4 sec.   Gait steady and fast without use of assistive device  Cognitive Function:        Immunizations Immunization History  Administered Date(s) Administered   Influenza, High Dose Seasonal PF 12/21/2017, 11/13/2018   Janssen (J&J) SARS-COV-2 Vaccination 05/01/2019   PPD Test 11/04/2018, 09/29/2019    TDAP status: Due, Education has been provided regarding the importance of this vaccine. Advised may receive this vaccine at local pharmacy or Health Dept. Aware to provide a copy of the vaccination record if obtained from local pharmacy or Health Dept. Verbalized acceptance and understanding.  Pneumococcal vaccine status: Declined,  Education has been provided regarding the importance of this vaccine but patient still declined. Advised may receive this vaccine at local pharmacy or Health Dept. Aware to provide a copy of the vaccination record if obtained from local pharmacy or Health Dept. Verbalized acceptance and understanding.   Pneumococcal  vaccine status: Declined,  Education has been provided regarding the importance of this vaccine but patient still declined. Advised may receive this vaccine at local pharmacy or Health Dept. Aware to provide a copy of the vaccination record if obtained from local pharmacy or Health Dept. Verbalized acceptance and understanding.   Covid-19 vaccine status: Declined, Education has been provided regarding the importance of this vaccine but patient still declined. Advised may receive this vaccine at local pharmacy or Health Dept.or vaccine clinic. Aware to provide a copy of the vaccination record if obtained from local pharmacy or Health Dept. Verbalized acceptance and understanding.  Qualifies for Shingles Vaccine? Yes   Zostavax completed No   Shingrix Completed?: No.    Education has been provided regarding the importance of this vaccine. Patient has been advised to call insurance company to determine out of pocket expense if they have not yet received this vaccine. Advised may also receive vaccine at local pharmacy or Health Dept. Verbalized acceptance and understanding.  Screening Tests Health Maintenance  Topic Date Due   TETANUS/TDAP  Never done   Zoster Vaccines- Shingrix (1 of 2) Never done   Pneumonia Vaccine 60+ Years old (1 - PCV) Never done   COVID-19 Vaccine (2 - Booster for [employer] series) 06/26/2019   INFLUENZA VACCINE  09/16/2020   Hepatitis C Screening  Completed   HPV VACCINES  Aged Out    Health Maintenance  Health Maintenance Due  Topic Date Due   TETANUS/TDAP  Never done   Zoster Vaccines- Shingrix (1 of 2) Never done   Pneumonia Vaccine 43+ Years old (1 - PCV) Never done   COVID-19 Vaccine (2 - Booster for Janssen series) 06/26/2019   INFLUENZA VACCINE  09/16/2020    Colorectal cancer screening: Type of screening: Colonoscopy. Completed 06/05/15. Repeat every 0 years aged out  Lung Cancer Screening: (Low Dose CT Chest recommended if Age 73-80 years, 30 pack-year  currently smoking OR have quit w/in 15years.) does not qualify.     Additional Screening:  Hepatitis C Screening: does qualify; Completed 10/04/19  Vision  Screening: Recommended annual ophthalmology exams for early detection of glaucoma and other disorders of the eye. Is the patient up to date with their annual eye exam?  Yes  Who is the provider or what is the name of the office in which the patient attends annual eye exams? [facility]  Dental Screening: Recommended annual dental exams for proper oral hygiene  Community Resource Referral / Chronic Care Management: CRR required this visit?  No   CCM required this visit?  No      Plan:     I have personally reviewed and noted the following in the patient's chart:   Medical and social history Use of alcohol, tobacco or illicit drugs  Current medications and supplements including opioid prescriptions. Patient is not currently taking opioid prescriptions. Functional ability and status Nutritional status Physical activity Advanced directives List of other physicians Hospitalizations, surgeries, and ER visits in previous 12 months Vitals Screenings to include cognitive, depression, and falls Referrals and appointments  In addition, I have reviewed and discussed with patient certain preventive protocols, quality metrics, and best practice recommendations. A written personalized care plan for preventive services as well as general preventive health recommendations were provided to patient.     Hal Hope, LPN   [id number]   Nurse Notes: note

## 2021-02-13 ENCOUNTER — Ambulatory Visit: Payer: Medicare HMO

## 2021-02-13 ENCOUNTER — Other Ambulatory Visit: Payer: Self-pay | Admitting: Family Medicine

## 2021-02-13 DIAGNOSIS — M159 Polyosteoarthritis, unspecified: Secondary | ICD-10-CM

## 2021-02-13 NOTE — Telephone Encounter (Signed)
Requested Prescriptions  Pending Prescriptions Disp Refills   diclofenac Sodium (VOLTAREN) 1 % GEL [Pharmacy Med Name: DICLOFENAC 1% GEL 100GM] 100 g 2    Sig: APPLY 2 GRAMS TOPICALLY TO THE AFFECTED AREA FOUR TIMES DAILY     Analgesics:  Topicals Passed - 02/13/2021 12:52 PM      Passed - Valid encounter within last 12 months    Recent Outpatient Visits          5 months ago Eczema, unspecified type   [facility] Smitty Cords, DO   9 months ago Bilateral impacted cerumen   [facility] Smitty Cords, DO   1 year ago Right inguinal hernia   [facility] [place], Netta Neat, DO   1 year ago Coccyx pain   [facility] Smitty Cords, DO   1 year ago Osteonecrosis of left hip [facility])   [facility] [place], Netta Neat, DO

## 2021-04-15 DIAGNOSIS — Z20822 Contact with and (suspected) exposure to covid-19: Secondary | ICD-10-CM | POA: Diagnosis not present

## 2021-04-15 DIAGNOSIS — M1990 Unspecified osteoarthritis, unspecified site: Secondary | ICD-10-CM | POA: Diagnosis not present

## 2021-04-15 DIAGNOSIS — K219 Gastro-esophageal reflux disease without esophagitis: Secondary | ICD-10-CM | POA: Diagnosis not present

## 2021-04-15 DIAGNOSIS — R059 Cough, unspecified: Secondary | ICD-10-CM | POA: Diagnosis not present

## 2021-04-15 DIAGNOSIS — Z8616 Personal history of COVID-19: Secondary | ICD-10-CM | POA: Diagnosis not present

## 2021-04-15 DIAGNOSIS — Z87891 Personal history of nicotine dependence: Secondary | ICD-10-CM | POA: Diagnosis not present

## 2021-04-15 DIAGNOSIS — Z791 Long term (current) use of non-steroidal anti-inflammatories (NSAID): Secondary | ICD-10-CM | POA: Diagnosis not present

## 2021-04-23 ENCOUNTER — Other Ambulatory Visit: Payer: Self-pay | Admitting: Family Medicine

## 2021-04-23 ENCOUNTER — Other Ambulatory Visit: Payer: Self-pay

## 2021-04-23 ENCOUNTER — Encounter: Payer: Self-pay | Admitting: Family Medicine

## 2021-04-23 ENCOUNTER — Ambulatory Visit (INDEPENDENT_AMBULATORY_CARE_PROVIDER_SITE_OTHER): Payer: Medicare HMO | Admitting: Family Medicine

## 2021-04-23 VITALS — BP 106/62 | HR 66 | Ht 68.0 in | Wt 146.0 lb

## 2021-04-23 DIAGNOSIS — J0110 Acute frontal sinusitis, unspecified: Secondary | ICD-10-CM

## 2021-04-23 DIAGNOSIS — M159 Polyosteoarthritis, unspecified: Secondary | ICD-10-CM

## 2021-04-23 DIAGNOSIS — N529 Male erectile dysfunction, unspecified: Secondary | ICD-10-CM

## 2021-04-23 MED ORDER — BENZONATATE 100 MG PO CAPS: 100.0000 mg | ORAL_CAPSULE | Freq: Three times a day (TID) | ORAL | 0 refills | Status: DC | PRN

## 2021-04-23 MED ORDER — FLUTICASONE PROPIONATE 50 MCG/ACT NA SUSP: 2.0000 | Freq: Every day | NASAL | 3 refills | Status: DC

## 2021-04-23 MED ORDER — AMOXICILLIN-POT CLAVULANATE 875-125 MG PO TABS: 1.0000 | ORAL_TABLET | Freq: Two times a day (BID) | ORAL | 0 refills | Status: DC

## 2021-04-23 MED ORDER — DICLOFENAC SODIUM 1 % EX GEL: CUTANEOUS | 2 refills | Status: DC

## 2021-04-23 MED ORDER — SILDENAFIL CITRATE 20 MG PO TABS: ORAL_TABLET | ORAL | 11 refills | Status: DC

## 2021-04-23 MED ORDER — IBUPROFEN 600 MG PO TABS: 600.0000 mg | ORAL_TABLET | Freq: Three times a day (TID) | ORAL | 2 refills | Status: DC | PRN

## 2021-04-23 NOTE — Addendum Note (Signed)
Addended by: Smitty Cords on: 04/23/2021 02:08 PM ? ? Modules accepted: Orders ? ?

## 2021-04-23 NOTE — Patient Instructions (Addendum)
Thank you for coming to the office today. ? ? ?1. It sounds like you have a Sinusitis (Bacterial Infection) - this most likely started as an Upper Respiratory Virus that has settled into an infection. Allergies can also cause this. ?- Start Augmentin 1 pill twice daily (breakfast and dinner, with food and plenty of water) for 10 days, complete entire course, do not stop early even if feeling better ?- Start Tessalon Perls take 1 capsule up to 3 times a day as needed for cough ?- Start nasal steroid Flonase 2 sprays in each nostril daily for 4-6 weeks, may repeat course seasonally or as needed ? ?- Improve hydration by drinking plenty of clear fluids (water, gatorade) to reduce secretions and thin congestion ?- Congestion draining down throat can cause irritation. May try warm herbal tea with honey, cough drops ?- Can take Tylenol or Ibuprofen as needed for fevers ?- May continue over the counter cold medicine as you are, I would not use any decongestant or mucinex longer than 7 days. ? ?If you develop persistent fever >101F for at least 3 consecutive days, headaches with sinus pain or pressure or persistent earache, please schedule a follow-up evaluation within next few days to week. ? ? ?Please schedule a Follow-up Appointment to: Return if symptoms worsen or fail to improve. ? ?If you have any other questions or concerns, please feel free to call the office or send a message through MyChart. You may also schedule an earlier appointment if necessary. ? ?Additionally, you may be receiving a survey about your experience at our office within a few days to 1 week by e-mail or mail. We value your feedback. ? ?Saralyn Pilar, DO ?[facility], [place] ?

## 2021-04-23 NOTE — Progress Notes (Signed)
? ?Subjective:  ? ? Patient ID: Kevin [name], male    DOB: 09-Apr-1940, 81 y.o.   MRN: [id number] ? ?Kevin [name] is a 81 y.o. male presenting on 04/23/2021 for Cough and Nasal Congestion ? ? ?HPI ? ?Sinusitis ?Reports 4-5 days of worsening sinus pressure congestion drainage, thicker sinus drainage sputum. ?He did COVID testing that was negative last week. ?Tried OTC cough syrup ? ? ?Depression screen [facility] 2/9 04/23/2021 01/17/2021 01/17/2021  ?Decreased Interest 3 0 0  ?Down, Depressed, Hopeless 0 0 0  ?PHQ - 2 Score 3 0 0  ?Altered sleeping 0 - -  ?Tired, decreased energy 0 - -  ?Change in appetite 0 - -  ?Feeling bad or failure about yourself  0 - -  ?Trouble concentrating 0 - -  ?Moving slowly or fidgety/restless 0 - -  ?Suicidal thoughts 0 - -  ?PHQ-9 Score 3 - -  ?Difficult doing work/chores Not difficult at all - -  ? ? ?Social History  ? ?Tobacco Use  ? Smoking status: Former  ? Smokeless tobacco: Never  ?Vaping Use  ? Vaping Use: Never used  ?Substance Use Topics  ? Alcohol use: Never  ?  Alcohol/week: 0.0 standard drinks  ?  Comment: rarely   ? Drug use: No  ? ? ?Review of Systems ?Per HPI unless specifically indicated above ? ?   ?Objective:  ?  ?BP 106/62   Pulse 66   Ht 5\' 8"  (1.727 m)   Wt 146 lb (66.2 kg)   SpO2 100%   BMI 22.20 kg/m?   ?Wt Readings from Last 3 Encounters:  ?04/23/21 146 lb (66.2 kg)  ?01/17/21 147 lb 3.2 oz (66.8 kg)  ?12/13/20 147 lb 12.8 oz (67 kg)  ?  ?Physical Exam ?Vitals and nursing note reviewed.  ?Constitutional:   ?   General: He is not in acute distress. ?   Appearance: He is well-developed. He is not diaphoretic.  ?   Comments: Well-appearing, comfortable, cooperative  ?HENT:  ?   Head: Normocephalic and atraumatic.  ?Eyes:  ?   General:     ?   Right eye: No discharge.     ?   Left eye: No discharge.  ?   Conjunctiva/sclera: Conjunctivae normal.  ?Neck:  ?   Thyroid: No thyromegaly.  ?Cardiovascular:  ?   Rate and Rhythm: Normal rate and regular rhythm.  ?   Pulses:  Normal pulses.  ?   Heart sounds: Normal heart sounds. No murmur heard. ?Pulmonary:  ?   Effort: Pulmonary effort is normal. No respiratory distress.  ?   Breath sounds: Normal breath sounds. No wheezing or rales.  ?Musculoskeletal:     ?   General: Normal range of motion.  ?   Cervical back: Normal range of motion and neck supple.  ?Lymphadenopathy:  ?   Cervical: No cervical adenopathy.  ?Skin: ?   General: Skin is warm and dry.  ?   Findings: No erythema or rash.  ?Neurological:  ?   Mental Status: He is alert and oriented to person, place, and time. Mental status is at baseline.  ?Psychiatric:     ?   Behavior: Behavior normal.  ?   Comments: Well groomed, good eye contact, normal speech and thoughts  ? ? ? ? ?Results for orders placed or performed during the hospital encounter of 02/16/20  ?SARS CORONAVIRUS 2 (TAT 6-24 HRS) Nasopharyngeal Nasopharyngeal Swab  ? Specimen: Nasopharyngeal Swab  ?Result Value  Ref Range  ? SARS Coronavirus 2 NEGATIVE NEGATIVE  ? ?   ?Assessment & Plan:  ? ?Problem List Items Addressed This Visit   ?None ?Visit Diagnoses   ? ? Acute non-recurrent frontal sinusitis    -  Primary  ? Relevant Medications  ? amoxicillin-clavulanate (AUGMENTIN) 875-125 MG tablet  ? benzonatate (TESSALON) 100 MG capsule  ? fluticasone (FLONASE) 50 MCG/ACT nasal spray  ? ?  ?  ?Consistent with acute frontal sinusitis, likely initially viral URI vs allergic rhinitis component with worsening concern for bacterial infection.  ? ?Plan: ?1. Start Augmentin 875-125mg  PO BID x 10 days ?2. Start nasal steroid Flonase 2 sprays in each nostril daily for 4-6 weeks, may repeat course seasonally or as needed ?3. Start Tessalon Perls take 1 capsule up to 3 times a day as needed for cough ? ? Return criteria reviewed ? ? ? ?Meds ordered this encounter  ?Medications  ? amoxicillin-clavulanate (AUGMENTIN) 875-125 MG tablet  ?  Sig: Take 1 tablet by mouth 2 (two) times daily.  ?  Dispense:  20 tablet  ?  Refill:  0  ?  benzonatate (TESSALON) 100 MG capsule  ?  Sig: Take 1 capsule (100 mg total) by mouth 3 (three) times daily as needed for cough.  ?  Dispense:  30 capsule  ?  Refill:  0  ? fluticasone (FLONASE) 50 MCG/ACT nasal spray  ?  Sig: Place 2 sprays into both nostrils daily. Use for 4-6 weeks then stop and use seasonally or as needed.  ?  Dispense:  16 g  ?  Refill:  3  ? ? ? ? ?Follow up plan: ?Return if symptoms worsen or fail to improve. ? ? ?Saralyn Pilar, DO ?[facility] ?04/23/2021, 2:01 PM ?

## 2021-04-23 NOTE — Addendum Note (Signed)
Addended by: Smitty Cords on: 04/23/2021 02:09 PM ? ? Modules accepted: Orders ? ?

## 2021-05-30 DIAGNOSIS — H02831 Dermatochalasis of right upper eyelid: Secondary | ICD-10-CM | POA: Diagnosis not present

## 2021-05-30 DIAGNOSIS — Z961 Presence of intraocular lens: Secondary | ICD-10-CM | POA: Diagnosis not present

## 2021-05-30 DIAGNOSIS — H02834 Dermatochalasis of left upper eyelid: Secondary | ICD-10-CM | POA: Diagnosis not present

## 2021-05-30 DIAGNOSIS — H18413 Arcus senilis, bilateral: Secondary | ICD-10-CM | POA: Diagnosis not present

## 2021-05-30 DIAGNOSIS — H40013 Open angle with borderline findings, low risk, bilateral: Secondary | ICD-10-CM | POA: Diagnosis not present

## 2021-07-10 DIAGNOSIS — Z961 Presence of intraocular lens: Secondary | ICD-10-CM | POA: Diagnosis not present

## 2021-07-10 DIAGNOSIS — H52223 Regular astigmatism, bilateral: Secondary | ICD-10-CM | POA: Diagnosis not present

## 2021-07-10 DIAGNOSIS — H524 Presbyopia: Secondary | ICD-10-CM | POA: Diagnosis not present

## 2021-11-18 ENCOUNTER — Ambulatory Visit: Payer: Medicare HMO

## 2021-12-02 ENCOUNTER — Ambulatory Visit: Payer: Medicare HMO

## 2021-12-09 ENCOUNTER — Ambulatory Visit: Payer: Medicare HMO

## 2022-01-23 ENCOUNTER — Ambulatory Visit (INDEPENDENT_AMBULATORY_CARE_PROVIDER_SITE_OTHER): Payer: Medicare HMO

## 2022-01-23 VITALS — Ht 68.0 in | Wt 146.0 lb

## 2022-01-23 DIAGNOSIS — Z0000 Encounter for general adult medical examination without abnormal findings: Secondary | ICD-10-CM | POA: Diagnosis not present

## 2022-01-23 NOTE — Patient Instructions (Signed)
Mr. Kevin [name] , Thank you for taking time to come for your Medicare Wellness Visit. I appreciate your ongoing commitment to your health goals. Please review the following plan we discussed and let me know if I can assist you in the future.   Screening recommendations/referrals: Colonoscopy: aged out Recommended yearly ophthalmology/optometry visit for glaucoma screening and checkup Recommended yearly dental visit for hygiene and checkup  Vaccinations: Influenza vaccine: n/d Pneumococcal vaccine: n/d Tdap vaccine: n/d Shingles vaccine: n/d   Covid-19: 05/01/19 [place]  Advanced directives: no  Conditions/risks identified: none  Next appointment: Follow up in one year for your annual wellness visit. 01/29/23 @ 9:30 by phone  Preventive Care 80 Years and Older, Male Preventive care refers to lifestyle choices and visits with your health care provider that can promote health and wellness. What does preventive care include? A yearly physical exam. This is also called an annual well check. Dental exams once or twice a year. Routine eye exams. Ask your health care provider how often you should have your eyes checked. Personal lifestyle choices, including: Daily care of your teeth and gums. Regular physical activity. Eating a healthy diet. Avoiding tobacco and drug use. Limiting alcohol use. Practicing safe sex. Taking low doses of aspirin every day. Taking vitamin and mineral supplements as recommended by your health care provider. What happens during an annual well check? The services and screenings done by your health care provider during your annual well check will depend on your age, overall health, lifestyle risk factors, and family history of disease. Counseling  Your health care provider may ask you questions about your: Alcohol use. Tobacco use. Drug use. Emotional well-being. Home and relationship well-being. Sexual activity. Eating habits. History of falls. Memory and  ability to understand (cognition). Work and work Astronomer. Screening  You may have the following tests or measurements: Height, weight, and BMI. Blood pressure. Lipid and cholesterol levels. These may be checked every 5 years, or more frequently if you are over 35 years old. Skin check. Lung cancer screening. You may have this screening every year starting at age 54 if you have a 30-pack-year history of smoking and currently smoke or have quit within the past 15 years. Fecal occult blood test (FOBT) of the stool. You may have this test every year starting at age 67. Flexible sigmoidoscopy or colonoscopy. You may have a sigmoidoscopy every 5 years or a colonoscopy every 10 years starting at age 47. Prostate cancer screening. Recommendations will vary depending on your family history and other risks. Hepatitis C blood test. Hepatitis B blood test. Sexually transmitted disease (STD) testing. Diabetes screening. This is done by checking your blood sugar (glucose) after you have not eaten for a while (fasting). You may have this done every 1-3 years. Abdominal aortic aneurysm (AAA) screening. You may need this if you are a current or former smoker. Osteoporosis. You may be screened starting at age 66 if you are at high risk. Talk with your health care provider about your test results, treatment options, and if necessary, the need for more tests. Vaccines  Your health care provider may recommend certain vaccines, such as: Influenza vaccine. This is recommended every year. Tetanus, diphtheria, and acellular pertussis (Tdap, Td) vaccine. You may need a Td booster every 10 years. Zoster vaccine. You may need this after age 28. Pneumococcal 13-valent conjugate (PCV13) vaccine. One dose is recommended after age 30. Pneumococcal polysaccharide (PPSV23) vaccine. One dose is recommended after age 5. Talk to your health care provider  about which screenings and vaccines you need and how often you need  them. This information is not intended to replace advice given to you by your health care provider. Make sure you discuss any questions you have with your health care provider. Document Released: 03/01/2015 Document Revised: 10/23/2015 Document Reviewed: 12/04/2014 Elsevier Interactive Patient Education  2017 [place] Prevention in the Home Falls can cause injuries. They can happen to people of all ages. There are many things you can do to make your home safe and to help prevent falls. What can I do on the outside of my home? Regularly fix the edges of walkways and driveways and fix any cracks. Remove anything that might make you trip as you walk through a door, such as a raised step or threshold. Trim any bushes or trees on the path to your home. Use bright outdoor lighting. Clear any walking paths of anything that might make someone trip, such as rocks or tools. Regularly check to see if handrails are loose or broken. Make sure that both sides of any steps have handrails. Any raised decks and porches should have guardrails on the edges. Have any leaves, snow, or ice cleared regularly. Use sand or salt on walking paths during winter. Clean up any spills in your garage right away. This includes oil or grease spills. What can I do in the bathroom? Use night lights. Install grab bars by the toilet and in the tub and shower. Do not use towel bars as grab bars. Use non-skid mats or decals in the tub or shower. If you need to sit down in the shower, use a plastic, non-slip stool. Keep the floor dry. Clean up any water that spills on the floor as soon as it happens. Remove soap buildup in the tub or shower regularly. Attach bath mats securely with double-sided non-slip rug tape. Do not have throw rugs and other things on the floor that can make you trip. What can I do in the bedroom? Use night lights. Make sure that you have a light by your bed that is easy to reach. Do not use  any sheets or blankets that are too big for your bed. They should not hang down onto the floor. Have a firm chair that has side arms. You can use this for support while you get dressed. Do not have throw rugs and other things on the floor that can make you trip. What can I do in the kitchen? Clean up any spills right away. Avoid walking on wet floors. Keep items that you use a lot in easy-to-reach places. If you need to reach something above you, use a strong step stool that has a grab bar. Keep electrical cords out of the way. Do not use floor polish or wax that makes floors slippery. If you must use wax, use non-skid floor wax. Do not have throw rugs and other things on the floor that can make you trip. What can I do with my stairs? Do not leave any items on the stairs. Make sure that there are handrails on both sides of the stairs and use them. Fix handrails that are broken or loose. Make sure that handrails are as long as the stairways. Check any carpeting to make sure that it is firmly attached to the stairs. Fix any carpet that is loose or worn. Avoid having throw rugs at the top or bottom of the stairs. If you do have throw rugs, attach them to the floor  with carpet tape. Make sure that you have a light switch at the top of the stairs and the bottom of the stairs. If you do not have them, ask someone to add them for you. What else can I do to help prevent falls? Wear shoes that: Do not have high heels. Have rubber bottoms. Are comfortable and fit you well. Are closed at the toe. Do not wear sandals. If you use a stepladder: Make sure that it is fully opened. Do not climb a closed stepladder. Make sure that both sides of the stepladder are locked into place. Ask someone to hold it for you, if possible. Clearly mark and make sure that you can see: Any grab bars or handrails. First and last steps. Where the edge of each step is. Use tools that help you move around (mobility aids)  if they are needed. These include: Canes. Walkers. Scooters. Crutches. Turn on the lights when you go into a dark area. Replace any light bulbs as soon as they burn out. Set up your furniture so you have a clear path. Avoid moving your furniture around. If any of your floors are uneven, fix them. If there are any pets around you, be aware of where they are. Review your medicines with your doctor. Some medicines can make you feel dizzy. This can increase your chance of falling. Ask your doctor what other things that you can do to help prevent falls. This information is not intended to replace advice given to you by your health care provider. Make sure you discuss any questions you have with your health care provider. Document Released: 11/29/2008 Document Revised: 07/11/2015 Document Reviewed: 03/09/2014 Elsevier Interactive Patient Education  2017 [employer].

## 2022-01-23 NOTE — Progress Notes (Signed)
Virtual Visit via Telephone Note  I connected with  Kevin [name] on 01/23/22 at 11:15 AM EST by telephone and verified that I am speaking with the correct person using two identifiers.  Location: Patient: home Provider: [facility] Persons participating in the virtual visit: patient/Nurse Health Advisor   I discussed the limitations, risks, security and privacy concerns of performing an evaluation and management service by telephone and the availability of in person appointments. The patient expressed understanding and agreed to proceed.  Interactive audio and video telecommunications were attempted between this nurse and patient, however failed, due to patient having technical difficulties OR patient did not have access to video capability.  We continued and completed visit with audio only.  Some vital signs may be absent or patient reported.   Hal Hope, LPN  Subjective:   Kevin [name] is a 81 y.o. male who presents for Medicare Annual/Subsequent preventive examination.  Review of Systems     Cardiac Risk Factors include: advanced age (>31men, >38 women);male gender     Objective:    There were no vitals filed for this visit. There is no height or weight on file to calculate BMI.     01/23/2022   11:22 AM 01/17/2021    3:33 PM 02/19/2020    1:44 PM 05/12/2016   11:27 PM  Advanced Directives  Does Patient Have a Medical Advance Directive? No No No No  Would patient like information on creating a medical advance directive? No - Patient declined No - Patient declined No - Patient declined No - Patient declined    Current Medications (verified) Outpatient Encounter Medications as of 01/23/2022  Medication Sig   diclofenac Sodium (VOLTAREN) 1 % GEL APPLY 2 GRAMS TOPICALLY TO THE AFFECTED AREA FOUR TIMES DAILY   fluticasone (FLONASE) 50 MCG/ACT nasal spray Place 2 sprays into both nostrils daily. Use for 4-6 weeks then stop and use seasonally or as needed.   ibuprofen (ADVIL)  600 MG tablet Take 1 tablet (600 mg total) by mouth every 8 (eight) hours as needed.   sildenafil (REVATIO) 20 MG tablet Use 1-2 pills daily as needed before sex.   triamcinolone cream (KENALOG) 0.5 % Apply 1 application topically 2 (two) times daily. To affected areas, for up to 2 weeks.   amoxicillin-clavulanate (AUGMENTIN) 875-125 MG tablet Take 1 tablet by mouth 2 (two) times daily. (Patient not taking: Reported on 01/23/2022)   benzonatate (TESSALON) 100 MG capsule Take 1 capsule (100 mg total) by mouth 3 (three) times daily as needed for cough. (Patient not taking: Reported on 01/23/2022)   BESIVANCE 0.6 % SUSP Place 1 drop into the right eye 3 (three) times daily. (Patient not taking: Reported on 01/17/2021)   ciclopirox (LOPROX) 0.77 % cream Apply topically 2 (two) times daily. (Patient not taking: Reported on 01/17/2021)   omeprazole (PRILOSEC) 20 MG capsule TAKE 1 CAPSULE(20 MG) BY MOUTH DAILY BEFORE BREAKFAST AS NEEDED (Patient not taking: Reported on 01/17/2021)   No facility-administered encounter medications on file as of 01/23/2022.    Allergies (verified) Tomato and Shellfish allergy   History: Past Medical History:  Diagnosis Date   Arthritis    GERD (gastroesophageal reflux disease)    Past Surgical History:  Procedure Laterality Date   COLONOSCOPY WITH PROPOFOL N/A 06/05/2015   Procedure: COLONOSCOPY WITH PROPOFOL;  Surgeon: Kieth Brightly, MD;  Location: [facility] ENDOSCOPY;  Service: Endoscopy;  Laterality: N/A;   HERNIA REPAIR Left 03/24/2004   inguinal    HERNIA  REPAIR Right 1990   inguinal   XI ROBOTIC ASSISTED INGUINAL HERNIA REPAIR WITH MESH Right 02/20/2020   Procedure: XI ROBOTIC ASSISTED INGUINAL HERNIA REPAIR WITH MESH, recurrent;  Surgeon: Henrene DodgePiscoya, Jose, MD;  Location: [facility] ORS;  Service: General;  Laterality: Right;   Family History  Problem Relation Age of Onset   Ovarian cancer Mother    Alcohol abuse Father    Social History   Socioeconomic History    Marital status: Married    Spouse name: Bonita QuinLinda   Number of children: 2   Years of education: Not on file   Highest education level: Not on file  Occupational History   Not on file  Tobacco Use   Smoking status: Former   Smokeless tobacco: Never  Vaping Use   Vaping Use: Never used  Substance and Sexual Activity   Alcohol use: Never    Alcohol/week: 0.0 standard drinks of alcohol    Comment: rarely    Drug use: No   Sexual activity: Not on file  Other Topics Concern   Not on file  Social History Narrative   Not on file   Social Determinants of Health   Financial Resource Strain: Low Risk  (01/23/2022)   Overall Financial Resource Strain (CARDIA)    Difficulty of Paying Living Expenses: Not hard at all  Food Insecurity: No Food Insecurity (01/23/2022)   Hunger Vital Sign    Worried About Running Out of Food in the Last Year: Never true    Ran Out of Food in the Last Year: Never true  Transportation Needs: No Transportation Needs (01/23/2022)   PRAPARE - Administrator, Civil ServiceTransportation    Lack of Transportation (Medical): No    Lack of Transportation (Non-Medical): No  Physical Activity: Sufficiently Active (01/23/2022)   Exercise Vital Sign    Days of Exercise per Week: 5 days    Minutes of Exercise per Session: 60 min  Stress: No Stress Concern Present (01/23/2022)   [employer] - Occupational Stress Questionnaire    Feeling of Stress : Not at all  Social Connections: Moderately Integrated (01/23/2022)   Social Connection and Isolation Panel [NHANES]    Frequency of Communication with Friends and Family: Three times a week    Frequency of Social Gatherings with Friends and Family: Never    Attends Religious Services: More than 4 times per year    Active Member of [employer]ubs or Organizations: No    Attends Engineer, structuralClub or Organization Meetings: Never    Marital Status: Married    Tobacco Counseling Counseling given: Not Answered   Clinical Intake:  Pre-visit  preparation completed: Yes  Pain : No/denies pain     Nutritional Risks: None Diabetes: No  How often do you need to have someone help you when you read instructions, pamphlets, or other written materials from your doctor or pharmacy?: 1 - Never  Diabetic?no  Interpreter Needed?: No  Information entered by :: Kennedy BuckerLorrie Kriston Mckinnie, LPN   Activities of Daily Living    01/23/2022   11:22 AM  In your present state of health, do you have any difficulty performing the following activities:  Hearing? 0  Vision? 0  Difficulty concentrating or making decisions? 0  Walking or climbing stairs? 0  Dressing or bathing? 0  Doing errands, shopping? 0  Preparing Food and eating ? N  Using the Toilet? N  In the past six months, have you accidently leaked urine? N  Do you have problems with loss of bowel control?  N  Managing your Medications? N  Managing your Finances? N  Housekeeping or managing your Housekeeping? N    Patient Care Team: Smitty Cords, DO as PCP - General (Family Medicine) Derwood Kaplan, MD (Internal Medicine) Kieth Brightly, MD (General Surgery)  Indicate any recent Medical Services you may have received from other than [facility] providers in the past year (date may be approximate).     Assessment:   This is a routine wellness examination for D[place]on.  Hearing/Vision screen Hearing Screening - Comments:: No aids Vision Screening - Comments:: Wears glasses- Dr.Newborn  Dietary issues and exercise activities discussed: Current Exercise Habits: Home exercise routine, Type of exercise: walking, Time (Minutes): 60, Frequency (Times/Week): 5, Weekly Exercise (Minutes/Week): 300, Intensity: Mild   Goals Addressed             This Visit's Progress    DIET - EAT MORE FRUITS AND VEGETABLES         Depression Screen    01/23/2022   11:20 AM 04/23/2021    1:54 PM 01/17/2021    3:34 PM 01/17/2021    3:31 PM 04/23/2020    2:36 PM 03/09/2019    8:31 AM  12/27/2018    3:50 PM  PHQ 2/9 Scores  PHQ - 2 Score 0 3 0 0 0 0 0  PHQ- 9 Score 0 3     0    Fall Risk    01/23/2022   11:22 AM 04/23/2021    1:54 PM 01/17/2021    3:34 PM 12/13/2020    9:58 AM 02/14/2020    3:36 PM  Fall Risk   Falls in the past year? 0 0 0 0 1  Number falls in past yr: 0 0 0  0  Injury with Fall? 0 0 0  0  Risk for fall due to : No Fall Risks No Fall Risks No Fall Risks    Follow up Falls prevention discussed;Falls evaluation completed Falls evaluation completed Falls prevention discussed      FALL RISK PREVENTION PERTAINING TO THE HOME:  Any stairs in or around the home? No  If so, are there any without handrails? No  Home free of loose throw rugs in walkways, pet beds, electrical cords, etc? Yes  Adequate lighting in your home to reduce risk of falls? Yes   ASSISTIVE DEVICES UTILIZED TO PREVENT FALLS:  Life alert? No  Use of a cane, walker or w/c? No  Grab bars in the bathroom? No  Shower chair or bench in shower? No  Elevated toilet seat or a handicapped toilet? No   Cognitive Function:        01/23/2022   11:23 AM  6CIT Screen  What Year? 0 points  What month? 0 points  What time? 0 points  Count back from 20 0 points  Months in reverse 0 points  Repeat phrase 2 points  Total Score 2 points    Immunizations Immunization History  Administered Date(s) Administered   Influenza, High Dose Seasonal PF 12/21/2017, 11/13/2018   Janssen (J&J) SARS-COV-2 Vaccination 05/01/2019   PPD Test 11/04/2018, 09/29/2019    TDAP status: Due, Education has been provided regarding the importance of this vaccine. Advised may receive this vaccine at local pharmacy or Health Dept. Aware to provide a copy of the vaccination record if obtained from local pharmacy or Health Dept. Verbalized acceptance and understanding.  Flu Vaccine status: Declined, Education has been provided regarding the importance of this vaccine but patient  still declined. Advised may  receive this vaccine at local pharmacy or Health Dept. Aware to provide a copy of the vaccination record if obtained from local pharmacy or Health Dept. Verbalized acceptance and understanding.  Pneumococcal vaccine status: Declined,  Education has been provided regarding the importance of this vaccine but patient still declined. Advised may receive this vaccine at local pharmacy or Health Dept. Aware to provide a copy of the vaccination record if obtained from local pharmacy or Health Dept. Verbalized acceptance and understanding.   Covid-19 vaccine status: Completed vaccines  Qualifies for Shingles Vaccine? No   Zostavax completed No   Shingrix Completed?: No.    Education has been provided regarding the importance of this vaccine. Patient has been advised to call insurance company to determine out of pocket expense if they have not yet received this vaccine. Advised may also receive vaccine at local pharmacy or Health Dept. Verbalized acceptance and understanding.  Screening Tests Health Maintenance  Topic Date Due   DTaP/Tdap/Td (1 - Tdap) Never done   Zoster Vaccines- Shingrix (1 of 2) Never done   Pneumonia Vaccine 73+ Years old (1 - PCV) Never done   INFLUENZA VACCINE  09/16/2021   COVID-19 Vaccine (2 - 2023-24 season) 10/17/2021   Medicare Annual Wellness (AWV)  01/24/2023   HPV VACCINES  Aged Out    Health Maintenance  Health Maintenance Due  Topic Date Due   DTaP/Tdap/Td (1 - Tdap) Never done   Zoster Vaccines- Shingrix (1 of 2) Never done   Pneumonia Vaccine 3+ Years old (1 - PCV) Never done   INFLUENZA VACCINE  09/16/2021   COVID-19 Vaccine (2 - 2023-24 season) 10/17/2021    Colorectal cancer screening: No longer required.   Lung Cancer Screening: (Low Dose CT Chest recommended if Age 54-80 years, 30 pack-year currently smoking OR have quit w/in 15years.) does not qualify.   Additional Screening:  Hepatitis C Screening: does not qualify; Completed no  Vision  Screening: Recommended annual ophthalmology exams for early detection of glaucoma and other disorders of the eye. Is the patient up to date with their annual eye exam?  Yes  Who is the provider or what is the name of the office in which the patient attends annual eye exams? Dr.Newborn If pt is not established with a provider, would they like to be referred to a provider to establish care? No .   Dental Screening: Recommended annual dental exams for proper oral hygiene  Community Resource Referral / Chronic Care Management: CRR required this visit?  No   CCM required this visit?  No      Plan:     I have personally reviewed and noted the following in the patient's chart:   Medical and social history Use of alcohol, tobacco or illicit drugs  Current medications and supplements including opioid prescriptions. Patient is not currently taking opioid prescriptions. Functional ability and status Nutritional status Physical activity Advanced directives List of other physicians Hospitalizations, surgeries, and ER visits in previous 12 months Vitals Screenings to include cognitive, depression, and falls Referrals and appointments  In addition, I have reviewed and discussed with patient certain preventive protocols, quality metrics, and best practice recommendations. A written personalized care plan for preventive services as well as general preventive health recommendations were provided to patient.     Hal Hope, LPN   [id number]/06/[id number]   Nurse Notes: none

## 2022-03-10 ENCOUNTER — Ambulatory Visit: Payer: Self-pay | Admitting: *Deleted

## 2022-03-10 ENCOUNTER — Ambulatory Visit: Payer: Self-pay

## 2022-03-10 NOTE — Telephone Encounter (Signed)
2nd attempt to return his call.  Left a voicemail to call back.

## 2022-03-10 NOTE — Telephone Encounter (Signed)
3rd attempt, Patient called, left VM to return the call to the office to discuss symptoms with a nurse. Unable to reach patient after 3 attempts by [facility] NT, routing to the provider for resolution per protocol.     Summary: congestion / rx req    The patient has had a runny nose and cold like symptoms since yesterday 03/09/22  The patient shares that they have taken cough syrup with little to no effect  The patient would like to be prescribed something for their symptoms  Please contact further when possible

## 2022-03-10 NOTE — Telephone Encounter (Signed)
  Chief Complaint: runny nose and cough. Requesting medication Symptoms: runny nose blows nose for white mucus large amounts. Cough productive white mucus loss of appetite.  Frequency: yesterday  Pertinent Negatives: Patient denies chest pain no difficulty breathing no headache no fever. No loss of taste or smell. Disposition: [] ED /[] Urgent Care (no appt availability in office) / [x] Appointment(In office/virtual)/ []  [place] [facility]/ [] Home Care/ [] Refused Recommended Disposition /[] [facility]/ []  Follow-up with PCP Additional Notes:   Requesting medication antibiotic and tessalon for cough, appt scheduled for tomorrow.     Reason for Disposition  Common cold with no complications  Answer Assessment - Initial Assessment Questions 1. ONSET: "When did the nasal discharge start?"      Yesterday  2. AMOUNT: "How much discharge is there?"      Large amount 3. COUGH: "Do you have a cough?" If Yes, ask: "Describe the color of your sputum" (clear, white, yellow, green)     Yes white mucus  4. RESPIRATORY DISTRESS: "Describe your breathing."      No difficulty breathing  5. FEVER: "Do you have a fever?" If Yes, ask: "What is your temperature, how was it measured, and when did it start?"     no 6. SEVERITY: "Overall, how bad are you feeling right now?" (e.g., doesn't interfere with normal activities, staying home from school/work, staying in bed)      Just wants medication to clear up sx  7. OTHER SYMPTOMS: "Do you have any other symptoms?" (e.g., sore throat, earache, wheezing, vomiting)     Runny nose cough, loss appetite 8. PREGNANCY: "Is there any chance you are pregnant?" "When was your last menstrual period?"     na  Protocols used: Common Cold-A-AH

## 2022-03-10 NOTE — Telephone Encounter (Signed)
I reviewed the triage note. But due to the recent onset of symptoms, only started 1 day prior, I will decline to order antibiotics at this time in advance of his apt tomorrow.  I will see patient in the office as scheduled on 1/24  He will need viral testing COVID Flu to rule out other cause of his acute respiratory symptoms and if negative we can pursue treatment plan.  Nobie Putnam, [facility] 03/10/2022, 6:32 PM

## 2022-03-10 NOTE — Telephone Encounter (Signed)
Patient called, left VM to return the call to the office to discuss symptoms with a nurse.  Summary: congestion / rx req   The patient has had a runny nose and cold like symptoms since yesterday 03/09/22  The patient shares that they have taken cough syrup with little to no effect  The patient would like to be prescribed something for their symptoms  Please contact further when possible

## 2022-03-11 ENCOUNTER — Encounter: Payer: Self-pay | Admitting: Family Medicine

## 2022-03-11 ENCOUNTER — Other Ambulatory Visit: Payer: Self-pay | Admitting: Family Medicine

## 2022-03-11 ENCOUNTER — Ambulatory Visit (INDEPENDENT_AMBULATORY_CARE_PROVIDER_SITE_OTHER): Payer: Medicare HMO | Admitting: Family Medicine

## 2022-03-11 ENCOUNTER — Ambulatory Visit: Payer: Medicare HMO | Admitting: Family Medicine

## 2022-03-11 VITALS — BP 129/64 | HR 80 | Temp 98.9°F | Ht 68.0 in | Wt 128.8 lb

## 2022-03-11 DIAGNOSIS — J101 Influenza due to other identified influenza virus with other respiratory manifestations: Secondary | ICD-10-CM | POA: Diagnosis not present

## 2022-03-11 DIAGNOSIS — R051 Acute cough: Secondary | ICD-10-CM

## 2022-03-11 DIAGNOSIS — N529 Male erectile dysfunction, unspecified: Secondary | ICD-10-CM

## 2022-03-11 LAB — POC INFLUENZA A&B (BINAX/QUICKVUE)
Influenza A, POC: POSITIVE — AB
Influenza B, POC: NEGATIVE

## 2022-03-11 LAB — POC COVID19 BINAXNOW: SARS Coronavirus 2 Ag: NEGATIVE

## 2022-03-11 MED ORDER — OSELTAMIVIR PHOSPHATE 75 MG PO CAPS: 75.0000 mg | ORAL_CAPSULE | Freq: Two times a day (BID) | ORAL | 0 refills | Status: DC

## 2022-03-11 MED ORDER — BENZONATATE 100 MG PO CAPS: 100.0000 mg | ORAL_CAPSULE | Freq: Three times a day (TID) | ORAL | 0 refills | Status: DC | PRN

## 2022-03-11 MED ORDER — IPRATROPIUM BROMIDE 0.06 % NA SOLN: 2.0000 | Freq: Four times a day (QID) | NASAL | 0 refills | Status: DC

## 2022-03-11 MED ORDER — SILDENAFIL CITRATE 100 MG PO TABS: 100.0000 mg | ORAL_TABLET | Freq: Every day | ORAL | 2 refills | Status: DC | PRN

## 2022-03-11 NOTE — Patient Instructions (Addendum)

## 2022-03-11 NOTE — Progress Notes (Signed)
Subjective:    Patient ID: Kevin [name], male    DOB: 1940-06-26, 82 y.o.   MRN: [id number]  Kevin [name] is a 82 y.o. male presenting on 03/11/2022 for Influenza  Patient presents for a same day appointment.  HPI  Influenza Reports recent onset few days of cough cold body aches feverish congestion. He has had some weight loss poor appetite. This is also a chronic problem with poor weight gain and wt loss in the past. Admits fatigue tired  Health Maintenance: Did not have flu shot this season     01/23/2022   11:20 AM 04/23/2021    1:54 PM 01/17/2021    3:34 PM  Depression screen PHQ 2/9  Decreased Interest 0 3 0  Down, Depressed, Hopeless 0 0 0  PHQ - 2 Score 0 3 0  Altered sleeping 0 0   Tired, decreased energy 0 0   Change in appetite 0 0   Feeling bad or failure about yourself  0 0   Trouble concentrating 0 0   Moving slowly or fidgety/restless 0 0   Suicidal thoughts 0 0   PHQ-9 Score 0 3   Difficult doing work/chores Not difficult at all Not difficult at all     Social History   Tobacco Use   Smoking status: Former   Smokeless tobacco: Never  Scientific laboratory technician Use: Never used  Substance Use Topics   Alcohol use: Never    Alcohol/week: 0.0 standard drinks of alcohol    Comment: rarely    Drug use: No    Review of Systems Per HPI unless specifically indicated above     Objective:    BP 129/64   Pulse 80   Temp 98.9 F (37.2 C) (Oral)   Ht 5\' 8"  (1.727 m)   Wt 128 lb 12.8 oz (58.4 kg)   SpO2 100%   BMI 19.58 kg/m   Wt Readings from Last 3 Encounters:  03/11/22 128 lb 12.8 oz (58.4 kg)  01/23/22 146 lb (66.2 kg)  04/23/21 146 lb (66.2 kg)    Physical Exam Vitals and nursing note reviewed.  Constitutional:      General: He is not in acute distress.    Appearance: He is well-developed. He is not diaphoretic.     Comments: Well-appearing thin, comfortable, cooperative  HENT:     Head: Normocephalic and atraumatic.  Eyes:     General:         Right eye: No discharge.        Left eye: No discharge.     Conjunctiva/sclera: Conjunctivae normal.  Neck:     Thyroid: No thyromegaly.  Cardiovascular:     Rate and Rhythm: Normal rate and regular rhythm.     Pulses: Normal pulses.     Heart sounds: Normal heart sounds. No murmur heard. Pulmonary:     Effort: Pulmonary effort is normal. No respiratory distress.     Breath sounds: Normal breath sounds. No wheezing or rales.  Musculoskeletal:        General: Normal range of motion.     Cervical back: Normal range of motion and neck supple.  Lymphadenopathy:     Cervical: No cervical adenopathy.  Skin:    General: Skin is warm and dry.     Findings: No erythema or rash.  Neurological:     Mental Status: He is alert and oriented to person, place, and time. Mental status is at baseline.  Psychiatric:  Behavior: Behavior normal.     Comments: Well groomed, good eye contact, normal speech and thoughts    Results for orders placed or performed in visit on 03/11/22  POC Influenza A&B (Binax test)  Result Value Ref Range   Influenza A, POC Positive (A) Negative   Influenza B, POC Negative Negative  POC COVID-19  Result Value Ref Range   SARS Coronavirus 2 Ag Negative Negative      Assessment & Plan:   Problem List Items Addressed This Visit   None Visit Diagnoses     Influenza A    -  Primary   Relevant Medications   oseltamivir (TAMIFLU) 75 MG capsule   ipratropium (ATROVENT) 0.06 % nasal spray   benzonatate (TESSALON) 100 MG capsule   Acute cough       Relevant Orders   POC Influenza A&B (Binax test) (Completed)   POC COVID-19 (Completed)       Clinically consistent with flu and confirmed Influenza A today on rapid flu test  - Duration x 2 days, without complication. Tolerating PO and well hydrated - No other focal findings of infection today - Did not receive influenza vaccine this season -  Plan: 1. Start Tamiflu 75mg  capsules BID x 5 days 2.  Supportive care as advised with NSAID / Tylenol PRN fever/myalgias, improve hydration, may take OTC Cold/Flu meds 3. Start Tessalon Perls take 1 capsule up to 3 times a day as needed for cough 4. Start Atrovent nasal spray decongestant 2 sprays in each nostril up to 4 times daily for 7 days  Return criteria given if significant worsening, consider post-influenza complications, otherwise follow-up if needed   Meds ordered this encounter  Medications   oseltamivir (TAMIFLU) 75 MG capsule    Sig: Take 1 capsule (75 mg total) by mouth 2 (two) times daily. For 5 days    Dispense:  10 capsule    Refill:  0   ipratropium (ATROVENT) 0.06 % nasal spray    Sig: Place 2 sprays into both nostrils 4 (four) times daily. For up to 5-7 days then stop.    Dispense:  15 mL    Refill:  0   benzonatate (TESSALON) 100 MG capsule    Sig: Take 1 capsule (100 mg total) by mouth 3 (three) times daily as needed for cough.    Dispense:  30 capsule    Refill:  0      Follow up plan: Return if symptoms worsen or fail to improve.    Nobie Putnam, [facility] 03/11/2022, 10:33 AM

## 2022-03-12 ENCOUNTER — Ambulatory Visit: Payer: Self-pay | Admitting: *Deleted

## 2022-03-12 NOTE — Telephone Encounter (Signed)
Please try to contact patient again or respond if he calls back.  I reviewed this triage. Chest pain should not be associated with Tamiflu side effect.  I saw him yesterday 03/11/22 initial diagnosis of Influenza A based on positive test, his symptoms were only present for approximately 2-3 days at this point.  He was not having chest pain yesterday.  His exam and history was not suggestive of pneumonia, given he did not have fever and still has no breathing difficulty and no fever.  I am unsure why he is having this chest pain,  otherwise it could be body aches muscle ache myalgia from the Flu.  He may discontinue the medication if he truly believes it is causing him the side effect and see how he does with Flu symptoms.  I do not recommend Amoxicillin for his symptoms and diagnosis.  However, I am concerned that he is experiencing this pain and if it does not improve or if it worsens, or he develops shortness of breath, higher fever or new sudden symptoms, he should seek care at hospital ED and may warrant chest imaging / rule out pneumonia.  Nobie Putnam, [facility] 03/12/2022, 3:40 PM

## 2022-03-12 NOTE — Telephone Encounter (Signed)
  Chief Complaint: chest pain after taking tamiflu x 3 pills Symptoms: chest pain middle of chest since awakening this am. Reports taking 2 doses of tamiflu yesterday and woke up with chest discomfort . At breakfast and took dose of tamiflu. Chest pain constant.  Frequency: this am  Pertinent Negatives: Patient denies difficulty breathing no fever reported. No sweating cold clammy skin reported. No vomiting no dizziness. No pain taking deep breath in.  Disposition: [x] ED /[] Urgent Care (no appt availability in office) / [] Appointment(In office/virtual)/ []  [place] [facility]/ [] Home Care/ [] Refused Recommended Disposition /[] [place] [facility]/ []  Follow-up with PCP Additional Notes:   Recommended ED and patient reports he feels pain is from taking tamiflu and would like a different medication like amoxicillin instead. Please advise. Patient is not going to ED at this time patient reports he is not going to take any further doses of tamiflu. Please advise.      Reason for Disposition  Patient sounds very sick or weak to the triager  Answer Assessment - Initial Assessment Questions 1. LOCATION: "Where does it hurt?"       Middle of chest  2. RADIATION: "Does the pain go anywhere else?" (e.g., into neck, jaw, arms, back)     No  3. ONSET: "When did the chest pain begin?" (Minutes, hours or days)      This am upon awakening  4. PATTERN: "Does the pain come and go, or has it been constant since it started?"  "Does it get worse with exertion?"      constant 5. DURATION: "How long does it last" (e.g., seconds, minutes, hours)     Since this am after breakfast 6. SEVERITY: "How bad is the pain?"  (e.g., Scale 1-10; mild, moderate, or severe)    - MILD (1-3): doesn't interfere with normal activities     - MODERATE (4-7): interferes with normal activities or awakens from sleep    - SEVERE (8-10): excruciating pain, unable to do any normal activities       Chest pain middle of chest  7.  CARDIAC RISK FACTORS: "Do you have any history of heart problems or risk factors for heart disease?" (e.g., angina, prior heart attack; diabetes, high blood pressure, high cholesterol, smoker, or strong family history of heart disease)     na 8. PULMONARY RISK FACTORS: "Do you have any history of lung disease?"  (e.g., blood clots in lung, asthma, emphysema, birth control pills)     na 9. CAUSE: "What do you think is causing the chest pain?"     tamiflu 10. OTHER SYMPTOMS: "Do you have any other symptoms?" (e.g., dizziness, nausea, vomiting, sweating, fever, difficulty breathing, cough)       Chest pain , nausea "feels funny" 11. PREGNANCY: "Is there any chance you are pregnant?" "When was your last menstrual period?"       na  Protocols used: Chest Pain-A-AH

## 2022-03-12 NOTE — Telephone Encounter (Signed)
  Chief Complaint: Pt is returning a call to [employer].   It looks like someone from there tried to call him a few minutes ago. Symptoms: He is not having chest pain and is not going to the ED per the triage notes from earlier today.   He insists the medicine Dr. Parks Ranger gave him needs to be changed.   (Tamiflu). Frequency: N/A Pertinent Negatives: Patient denies having chest pain Disposition: [] ED /[] Urgent Care (no appt availability in office) / [] Appointment(In office/virtual)/ []  [place] [facility]/ [] Home Care/ [] Refused Recommended Disposition /[] [place] [facility]/ [x]  Follow-up with PCP Additional Notes: I looks like someone tried to call this pt a few minutes ago from the office.   Please give him a call back.

## 2022-03-12 NOTE — Telephone Encounter (Signed)
Reason for Disposition  [1] Caller has URGENT medicine question about med that PCP or specialist prescribed AND [2] triager unable to answer question  Answer Assessment - Initial Assessment Questions 1. NAME of MEDICINE: "What medicine(s) are you calling about?"     Tamiflu 2. QUESTION: "What is your question?" (e.g., double dose of medicine, side effect)     Pt was triaged earlier today and instructed to go to the ED.   He is refusing to go to the ED because "I'm not having chest pain I just need the medicine changed because my chest don't feel good". He said someone from the office just tried to call him.    I attempted to call into [place] but no one answered.   I let him know they were in seeing pts so I would send a message asking them to give you a call back. He thanked me for this and insisted he just needed the medicine changed.   He wasn't having chest pain and wasn't going to the ED 3. PRESCRIBER: "Who prescribed the medicine?" Reason: if prescribed by specialist, call should be referred to that group.     Dr. Parks Ranger. 4. SYMPTOMS: "Do you have any symptoms?" If Yes, ask: "What symptoms are you having?"  "How bad are the symptoms (e.g., mild, moderate, severe)     See triage notes from earlier today.   Pt is denying chest pain. 5. PREGNANCY:  "Is there any chance that you are pregnant?" "When was your last menstrual period?"     N/A  Protocols used: Medication Question Call-A-AH

## 2022-03-13 NOTE — Telephone Encounter (Signed)
I attempted to call him.. Left a message going over last notes and asked him to return the call.

## 2022-04-06 ENCOUNTER — Ambulatory Visit: Payer: Self-pay

## 2022-04-06 DIAGNOSIS — B356 Tinea cruris: Secondary | ICD-10-CM

## 2022-04-06 NOTE — Telephone Encounter (Signed)
Message from [place] sent at 04/06/2022  3:02 PM EST  Summary: med for joch itch   Patient called in requesting med for joch itch he had prescribed before. He doesn't know the name of it.        Called pt and LM on VM to call back to discuss sx. Med was: ciclopirox (loprox) 0.77% cream Ordered 08/23/20 15 grams no refills Expired on 08/23/21.

## 2022-04-06 NOTE — Telephone Encounter (Signed)
Summary: med for joch itch   Patient called in requesting med for joch itch he had prescribed before. He doesn't know the name of it.      Called pt - left message on machine to call back.

## 2022-04-07 ENCOUNTER — Other Ambulatory Visit: Payer: Self-pay

## 2022-04-07 ENCOUNTER — Telehealth: Payer: Self-pay | Admitting: Family Medicine

## 2022-04-07 DIAGNOSIS — B356 Tinea cruris: Secondary | ICD-10-CM

## 2022-04-07 MED ORDER — CICLOPIROX OLAMINE 0.77 % EX CREA: TOPICAL_CREAM | Freq: Two times a day (BID) | CUTANEOUS | 3 refills | Status: DC | PRN

## 2022-04-07 NOTE — Telephone Encounter (Signed)
Please notify patient. I have ordered the medication. It is called Ciclopirox cream.  Note it was actually on his current med list but dated 2022.  I will re order it now.  Nobie Putnam, [facility] 04/07/2022, 2:51 PM

## 2022-04-07 NOTE — Telephone Encounter (Signed)
I just called again, no option this time to leave a message. If he calls back, please advise him that I have just sent the [facility] to [facility] [place].

## 2022-04-07 NOTE — Addendum Note (Signed)
Addended by: Olin Hauser on: 04/07/2022 02:53 PM   Modules accepted: Orders

## 2022-04-07 NOTE — Telephone Encounter (Signed)
Patient is returning a call and is requesting the ciclopirox (LOPROX) 0.77 % cream [id number] be [facility]routed [facility] [place]. Patient says his insurance won't cover it at [employer]. Please follow up with patient.

## 2022-04-07 NOTE — Telephone Encounter (Signed)
I called and left a detailed message that Dr. Raliegh Ip sent the cream in to [facility] on [address] in [place].

## 2022-04-22 ENCOUNTER — Other Ambulatory Visit: Payer: Self-pay | Admitting: Family Medicine

## 2022-04-22 DIAGNOSIS — M159 Polyosteoarthritis, unspecified: Secondary | ICD-10-CM

## 2022-04-23 NOTE — Telephone Encounter (Signed)
Requested medication (s) are due for refill today: yes   Requested medication (s) are on the active medication list: yes   Last refill:  04/23/21 #100 g 2 refills  Future visit scheduled: no   Notes to clinic:  protocol failed. Last labs 09/26/19 do you want to refill Rx?     Requested Prescriptions  Pending Prescriptions Disp Refills   diclofenac Sodium (VOLTAREN) 1 % GEL [Pharmacy Med Name: DICLOFENAC SODIUM 1% GEL] 100 g 2    Sig: APPLY 2 GRAMS TOPICALLY TO THE AFFECTED AREA FOUR TIMES DAILY     Analgesics:  Topicals Failed - 04/22/2022  4:43 PM      Failed - Manual Review: Labs are only required if the patient has taken medication for more than 8 weeks.      Failed - PLT in normal range and within 360 days    Platelets  Date Value Ref Range Status  09/26/2019 192 140 - 400 Thousand/uL Final  05/29/2015 233 150 - 379 x10E3/uL Final         Failed - HGB in normal range and within 360 days    Hemoglobin  Date Value Ref Range Status  09/26/2019 13.1 (L) 13.2 - 17.1 g/dL Final  05/29/2015 13.2 12.6 - 17.7 g/dL Final         Failed - HCT in normal range and within 360 days    HCT  Date Value Ref Range Status  09/26/2019 42.5 38.5 - 50.0 % Final   Hematocrit  Date Value Ref Range Status  05/29/2015 41.0 37.5 - 51.0 % Final         Failed - Cr in normal range and within 360 days    Creat  Date Value Ref Range Status  09/26/2019 0.99 0.70 - 1.18 mg/dL Final    Comment:    For patients >9 years of age, the reference limit for Creatinine is approximately 13% higher for people identified as African-American. .          Failed - eGFR is 30 or above and within 360 days    GFR, Est African American  Date Value Ref Range Status  09/26/2019 85 > OR = 60 mL/min/1.103m Final   GFR, Est Non African American  Date Value Ref Range Status  09/26/2019 73 > OR = 60 mL/min/1.781mFinal         Passed - Patient is not pregnant      Passed - Valid encounter within last 12  months    Recent Outpatient Visits           1 month ago Influenza A [place]   1 year ago Acute non-recurrent frontal sinusitis   [place]   1 year ago Eczema, unspecified type   [place]   2 years ago Bilateral impacted cerumen   [facility]   2 years ago Right inguinal hernia   [place]

## 2022-06-02 IMAGING — DX DG CHEST 2V
2 series · 2 of 2 positions shown · non-contrast
Comparison: 11/20/2010

CLINICAL DATA: Unintentional weight loss

EXAM:
CHEST - 2 VIEW

[chest pa]
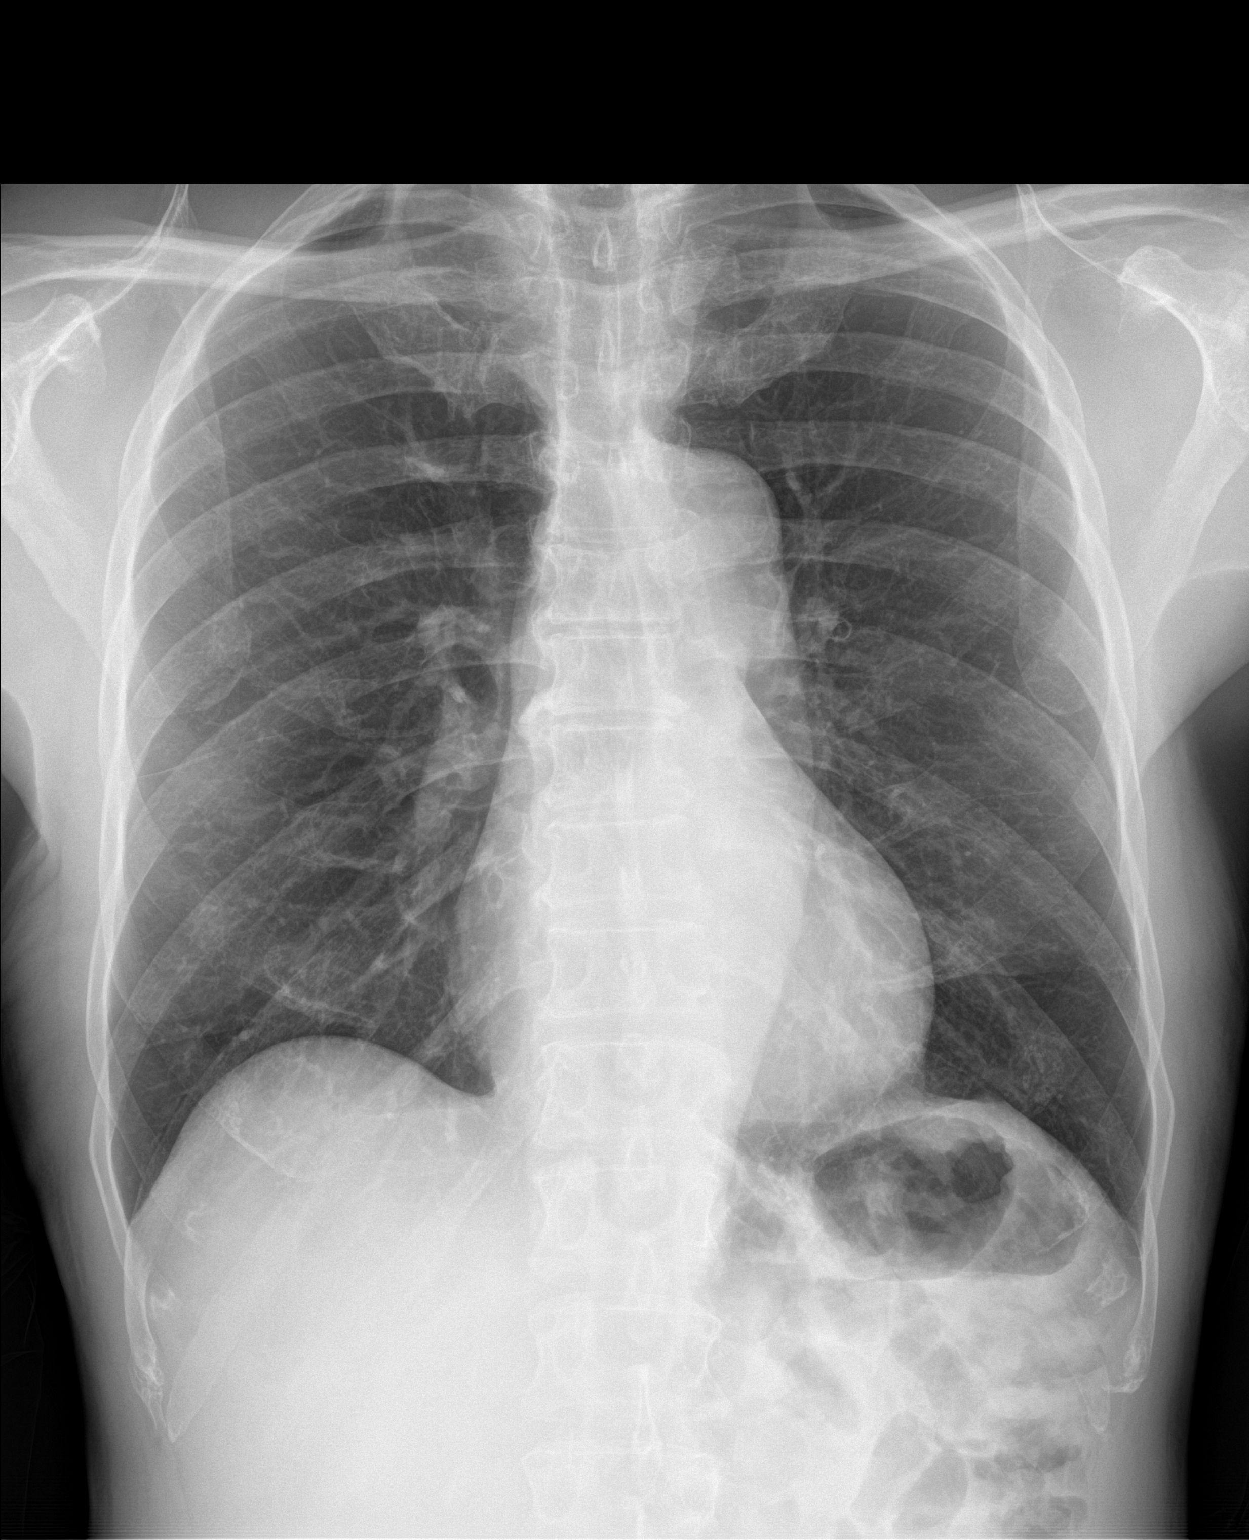

[chest lat]
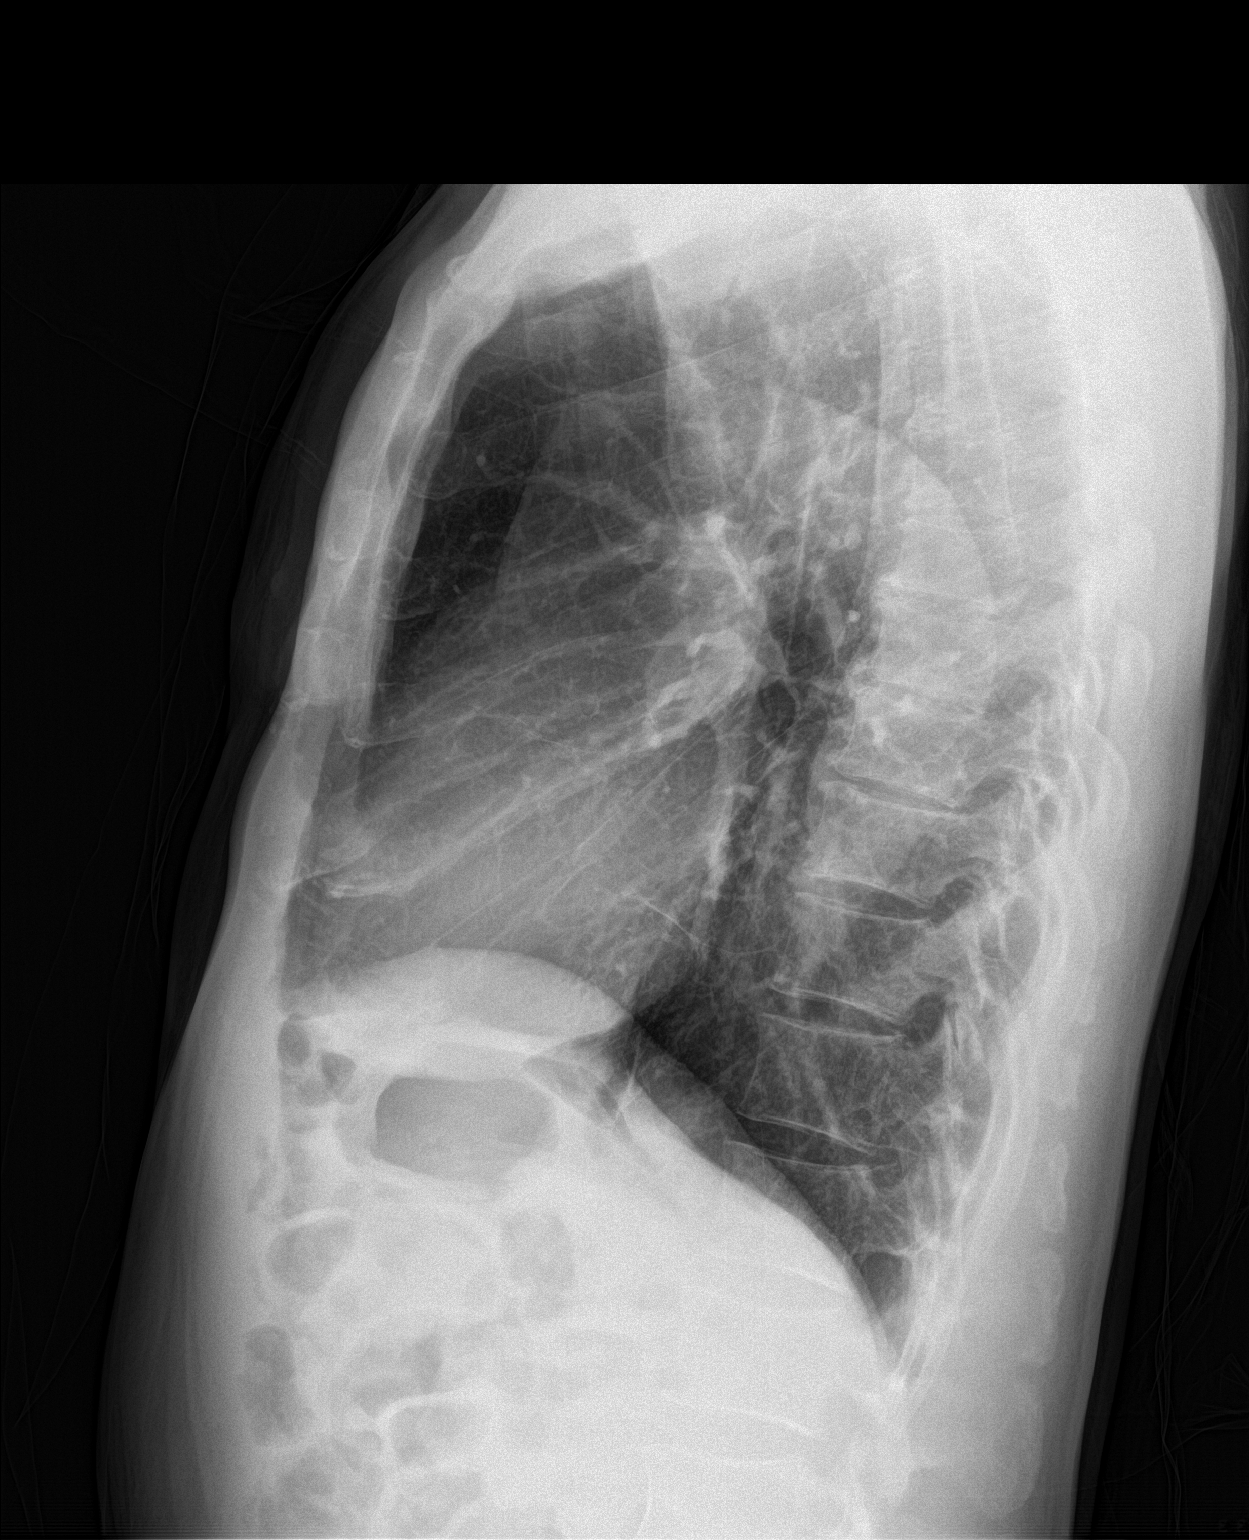

[2 of 2 positions shown; findings below may reference images not displayed]

FINDINGS: The heart size and mediastinal contours are within normal limits.
Both lungs are clear. The visualized skeletal structures are
unremarkable.
IMPRESSION: No active cardiopulmonary disease.

## 2022-06-02 IMAGING — DX DG SACRUM/COCCYX 2+V
3 series · 3 of 3 positions shown · non-contrast
Comparison: 04/17/2013

CLINICAL DATA: Sacrococcygeal pain after fall 1.5 weeks ago

EXAM:
SACRUM AND COCCYX - 2+ VIEW

[sacrum ap]
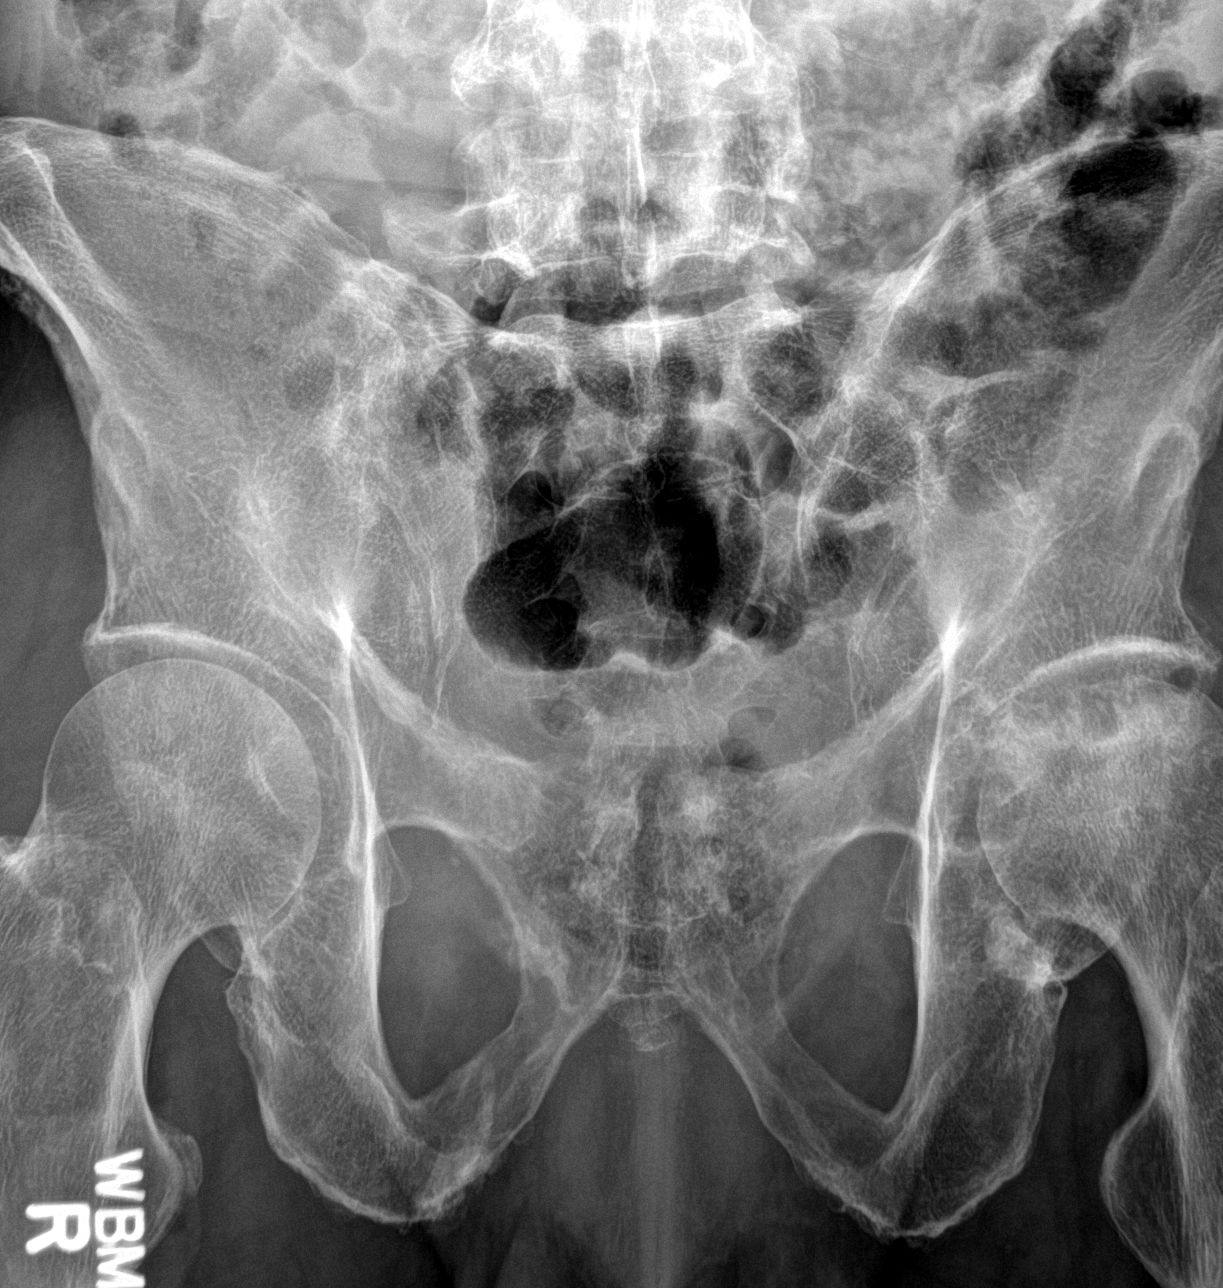

[coccyx ap]
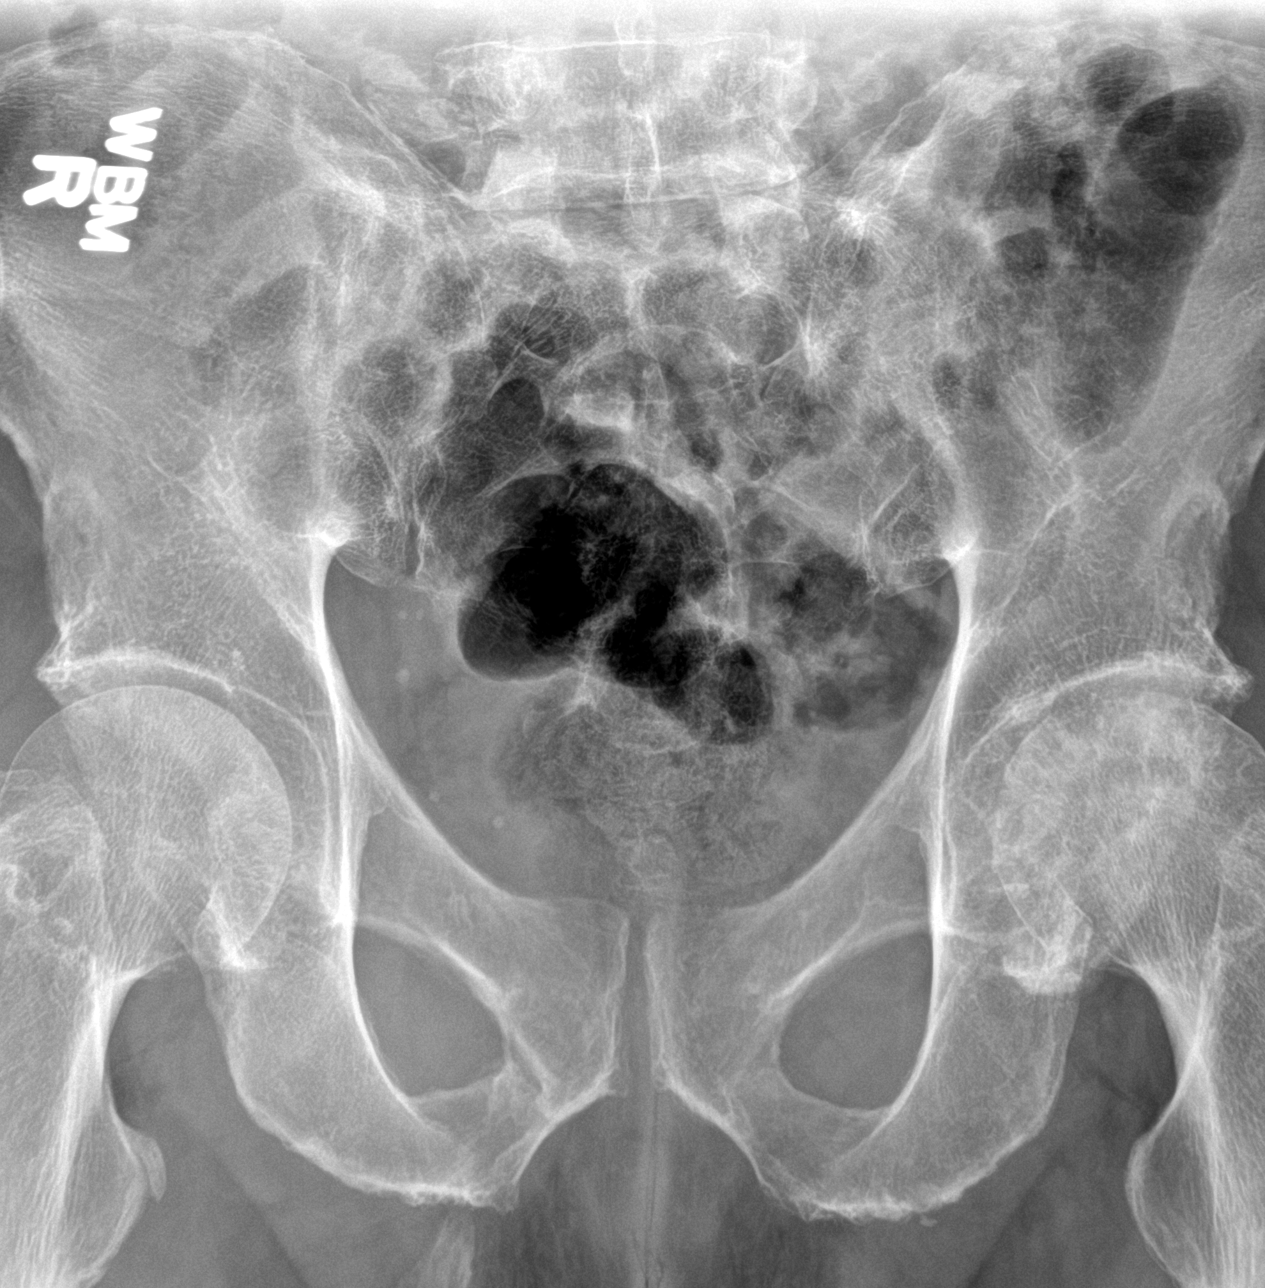

[sacrum lat]
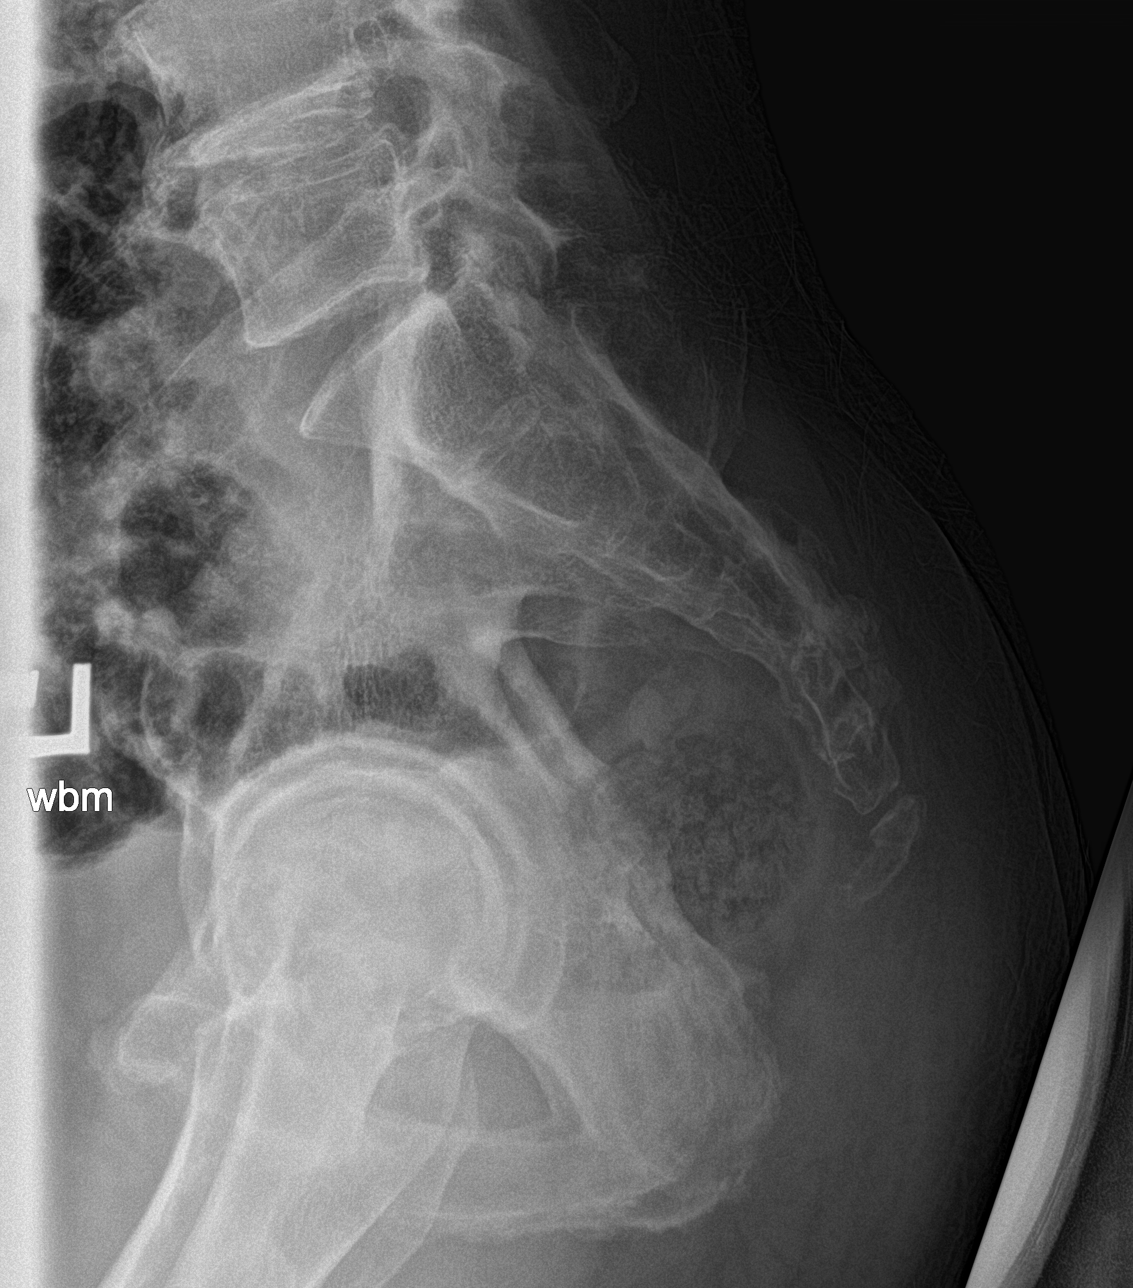

[3 of 3 positions shown; findings below may reference images not displayed]

FINDINGS: Sacrum and coccyx are largely obscured by overlying bowel gas and
stool on frontal views, limiting its evaluation. Possible
nondisplaced fracture of the second coccygeal segment on lateral
view. Findings of progressive avascular necrosis of the left hip
with some flattening of the femoral head compatible with subchondral
collapse.
IMPRESSION: 1. Possible nondisplaced fracture of the second coccygeal segment on
lateral view.
2. Findings of progressive avascular necrosis of the left hip with
subchondral collapse, progressed from prior.

## 2022-07-18 ENCOUNTER — Other Ambulatory Visit: Payer: Self-pay | Admitting: Family Medicine

## 2022-07-18 DIAGNOSIS — N529 Male erectile dysfunction, unspecified: Secondary | ICD-10-CM

## 2022-07-20 NOTE — Telephone Encounter (Signed)
Requested medication (s) are due for refill today: yes  Requested medication (s) are on the active medication list: yes  Last refill:  03/11/22  Future visit scheduled: yes  Notes to clinic:  please clarify- 2 doses of ED med on profile.    Requested Prescriptions  Pending Prescriptions Disp Refills   sildenafil (VIAGRA) 100 MG tablet [Pharmacy Med Name: SILDENAFIL CITRATE 100 MG TAB] 5 tablet 2    Sig: TAKE 1 TABLET BY MOUTH DAILY AS NEEDED FOR ERECTILE DYSFUNCTION     Urology: Erectile Dysfunction Agents Failed - 07/18/2022 11:39 AM      Failed - AST in normal range and within 360 days    AST  Date Value Ref Range Status  09/26/2019 18 10 - 35 U/L Final         Failed - ALT in normal range and within 360 days    ALT  Date Value Ref Range Status  09/26/2019 11 9 - 46 U/L Final         Passed - Last BP in normal range    BP Readings from Last 1 Encounters:  03/11/22 129/64         Passed - Valid encounter within last 12 months    Recent Outpatient Visits           4 months ago Influenza A   [place] [facility] Smitty Cords, DO   1 year ago Acute non-recurrent frontal sinusitis   [place] [facility] Smitty [facility]ords, DO   1 year ago Eczema, unspecified type   [facility] Smitty Cords, DO   2 years ago Bilateral impacted cerumen   [place] [facility] Smitty Cords, DO   2 years ago Right inguinal hernia    [facility], [place], [place]

## 2022-07-25 ENCOUNTER — Other Ambulatory Visit: Payer: Self-pay | Admitting: Family Medicine

## 2022-07-25 DIAGNOSIS — M159 Polyosteoarthritis, unspecified: Secondary | ICD-10-CM

## 2022-07-27 NOTE — Telephone Encounter (Signed)
Requested medication (s) are due for refill today: Yes  Requested medication (s) are on the active medication list: Yes  Last refill:  04/23/21  Future visit scheduled: No  Notes to clinic:  Protocol indicates pt. Needs lab work.    Requested Prescriptions  Pending Prescriptions Disp Refills   ibuprofen (ADVIL) 600 MG tablet [Pharmacy Med Name: IBUPROFEN 600 MG TABLET] 90 tablet 2    Sig: TAKE 1 TABLET BY MOUTH EVERY 8 HOURS AS NEEDED.     Analgesics:  NSAIDS Failed - 07/25/2022  3:06 PM      Failed - Manual Review: Labs are only required if the patient has taken medication for more than 8 weeks.      Failed - Cr in normal range and within 360 days    Creat  Date Value Ref Range Status  09/26/2019 0.99 0.70 - 1.18 mg/dL Final    Comment:    For patients >63 years of age, the reference limit for Creatinine is approximately 13% higher for people identified as African-American. .          Failed - HGB in normal range and within 360 days    Hemoglobin  Date Value Ref Range Status  09/26/2019 13.1 (L) 13.2 - 17.1 g/dL Final  16/11/9602 54.0 12.6 - 17.7 g/dL Final         Failed - PLT in normal range and within 360 days    Platelets  Date Value Ref Range Status  09/26/2019 192 140 - 400 Thousand/uL Final  05/29/2015 233 150 - 379 x10E3/uL Final         Failed - HCT in normal range and within 360 days    HCT  Date Value Ref Range Status  09/26/2019 42.5 38.5 - 50.0 % Final   Hematocrit  Date Value Ref Range Status  05/29/2015 41.0 37.5 - 51.0 % Final         Failed - eGFR is 30 or above and within 360 days    GFR, Est African American  Date Value Ref Range Status  09/26/2019 85 > OR = 60 mL/min/1.6m2 Final   GFR, Est Non African American  Date Value Ref Range Status  09/26/2019 73 > OR = 60 mL/min/1.50m2 Final         Passed - Patient is not pregnant      Passed - Valid encounter within last 12 months    Recent Outpatient Visits           4 months ago  Influenza A   [place] [facility]mitty Cords, DO   1 year ago Acute non-recurrent frontal sinusitis   [place] [facility] Smitty Cords, DO   1 year ago Eczema, unspecified type   [facility] Smit[facility] C[facility], DO   2 years ago Bilateral impacted cerumen   [place] [facility] Smitty Cords, DO   2 years ago Right inguinal hernia   [place] [facility] [place], Netta Ne[place], [place]

## 2022-07-31 ENCOUNTER — Other Ambulatory Visit: Payer: Self-pay | Admitting: Family Medicine

## 2022-07-31 DIAGNOSIS — M150 Primary generalized (osteo)arthritis: Secondary | ICD-10-CM

## 2022-07-31 DIAGNOSIS — M159 Polyosteoarthritis, unspecified: Secondary | ICD-10-CM

## 2022-07-31 NOTE — Telephone Encounter (Signed)
Requested medication (s) are due for refill today: yes  Requested medication (s) are on the active medication list: yes  Last refill:  04/23/22  Future visit scheduled: no  Notes to clinic:  Unable to refill per protocol due to failed labs, no updated results.      Requested Prescriptions  Pending Prescriptions Disp Refills   diclofenac Sodium (VOLTAREN) 1 % GEL [Pharmacy Med Name: DICLOFENAC SODIUM 1% GEL] 100 g 2    Sig: APPLY 2 GRAMS TOPICALLY TO THE AFFECTED AREA FOUR TIMES DAILY     Analgesics:  Topicals Failed - 07/31/2022  2:23 AM      Failed - Manual Review: Labs are only required if the patient has taken medication for more than 8 weeks.      Failed - PLT in normal range and within 360 days    Platelets  Date Value Ref Range Status  09/26/2019 192 140 - 400 Thousand/uL Final  05/29/2015 233 150 - 379 x10E3/uL Final         Failed - HGB in normal range and within 360 days    Hemoglobin  Date Value Ref Range Status  09/26/2019 13.1 (L) 13.2 - 17.1 g/dL Final  16/11/9602 54.0 12.6 - 17.7 g/dL Final         Failed - HCT in normal range and within 360 days    HCT  Date Value Ref Range Status  09/26/2019 42.5 38.5 - 50.0 % Final   Hematocrit  Date Value Ref Range Status  05/29/2015 41.0 37.5 - 51.0 % Final         Failed - Cr in normal range and within 360 days    Creat  Date Value Ref Range Status  09/26/2019 0.99 0.70 - 1.18 mg/dL Final    Comment:    For patients >37 years of age, the reference limit for Creatinine is approximately 13% higher for people identified as African-American. .          Failed - eGFR is 30 or above and within 360 days    GFR, Est African American  Date Value Ref Range Status  09/26/2019 85 > OR = 60 mL/min/1.74m2 Final   GFR, Est Non African American  Date Value Ref Range Status  09/26/2019 73 > OR = 60 mL/min/1.50m2 Final         Passed - Patient is not pregnant      Passed - Valid encounter within last 12 months     Recent Outpatient Visits           4 months ago Influenza A   [place] [facility] Smitty Cords, DO   1 year ago Acute non-recurrent frontal sinusitis   [place] [facility] Smitty Cords, DO   1 year ago Eczema, unspecified type   [place] [facility] Smitty Cords, DO   2 years ago Bilateral impacted cerumen   [place] [facility] Smitty Cords, DO   2 years ago Right inguinal hernia   [place] [facility] [place], [place], [place]

## 2022-11-04 DIAGNOSIS — H524 Presbyopia: Secondary | ICD-10-CM | POA: Diagnosis not present

## 2022-11-04 DIAGNOSIS — H40023 Open angle with borderline findings, high risk, bilateral: Secondary | ICD-10-CM | POA: Diagnosis not present

## 2022-11-04 DIAGNOSIS — H25813 Combined forms of age-related cataract, bilateral: Secondary | ICD-10-CM | POA: Diagnosis not present

## 2022-11-17 DIAGNOSIS — Z0100 Encounter for examination of eyes and vision without abnormal findings: Secondary | ICD-10-CM | POA: Diagnosis not present

## 2022-12-15 ENCOUNTER — Other Ambulatory Visit: Payer: Self-pay | Admitting: Family Medicine

## 2022-12-15 DIAGNOSIS — M150 Primary generalized (osteo)arthritis: Secondary | ICD-10-CM

## 2022-12-16 NOTE — Telephone Encounter (Signed)
Requested medication (s) are due for refill today: Yes  Requested medication (s) are on the active medication list: Yes  Last refill:  07/31/22  Future visit scheduled: Yes  Notes to clinic:  Manual review.    Requested Prescriptions  Pending Prescriptions Disp Refills   diclofenac Sodium (VOLTAREN) 1 % GEL [Pharmacy Med Name: DICLOFENAC 1% GEL 100GM (RX)] 100 g 2    Sig: APPLY 2 GRAMS TOPICALLY TO THE AFFECTED AREA FOUR TIMES DAILY     Analgesics:  Topicals Failed - 12/15/2022  5:26 PM      Failed - Manual Review: Labs are only required if the patient has taken medication for more than 8 weeks.      Failed - PLT in normal range and within 360 days    Platelets  Date Value Ref Range Status  09/26/2019 192 140 - 400 Thousand/uL Final  05/29/2015 233 150 - 379 x10E3/uL Final         Failed - HGB in normal range and within 360 days    Hemoglobin  Date Value Ref Range Status  09/26/2019 13.1 (L) 13.2 - 17.1 g/dL Final  21/30/8657 84.6 12.6 - 17.7 g/dL Final         Failed - HCT in normal range and within 360 days    HCT  Date Value Ref Range Status  09/26/2019 42.5 38.5 - 50.0 % Final   Hematocrit  Date Value Ref Range Status  05/29/2015 41.0 37.5 - 51.0 % Final         Failed - Cr in normal range and within 360 days    Creat  Date Value Ref Range Status  09/26/2019 0.99 0.70 - 1.18 mg/dL Final    Comment:    For patients >37 years of age, the reference limit for Creatinine is approximately 13% higher for people identified as African-American. .          Failed - eGFR is 30 or above and within 360 days    GFR, Est African American  Date Value Ref Range Status  09/26/2019 85 > OR = 60 mL/min/1.92m2 Final   GFR, Est Non African American  Date Value Ref Range Status  09/26/2019 73 > OR = 60 mL/min/1.83m2 Final         Passed - Patient is not pregnant      Passed - Valid encounter within last 12 months    Recent Outpatient Visits           9 months ago  Influenza A   [place] [facility] Smitty Cords, DO   1 year ago Acute non-recurrent frontal sinusitis   [place] [facility] [place], Netta Neat, DO   2 years ago Eczema, unspecified type   [facility] Smitty Cords, DO   2 years ago Bilateral impacted cerumen   [place] [facility] Smitty Cords, DO   2 years ago Right inguinal hernia   [place] [facility] [place], [place], [place]

## 2023-01-17 DIAGNOSIS — K469 Unspecified abdominal hernia without obstruction or gangrene: Secondary | ICD-10-CM | POA: Diagnosis not present

## 2023-01-17 DIAGNOSIS — R143 Flatulence: Secondary | ICD-10-CM | POA: Diagnosis not present

## 2023-01-17 DIAGNOSIS — R109 Unspecified abdominal pain: Secondary | ICD-10-CM | POA: Diagnosis not present

## 2023-01-19 ENCOUNTER — Other Ambulatory Visit: Payer: Self-pay | Admitting: Family Medicine

## 2023-01-19 ENCOUNTER — Encounter: Payer: Self-pay | Admitting: Family Medicine

## 2023-01-19 ENCOUNTER — Ambulatory Visit (INDEPENDENT_AMBULATORY_CARE_PROVIDER_SITE_OTHER): Payer: Medicare HMO | Admitting: Family Medicine

## 2023-01-19 VITALS — BP 112/64 | Ht 68.0 in | Wt 130.0 lb

## 2023-01-19 DIAGNOSIS — K219 Gastro-esophageal reflux disease without esophagitis: Secondary | ICD-10-CM

## 2023-01-19 DIAGNOSIS — K429 Umbilical hernia without obstruction or gangrene: Secondary | ICD-10-CM | POA: Diagnosis not present

## 2023-01-19 DIAGNOSIS — R7309 Other abnormal glucose: Secondary | ICD-10-CM

## 2023-01-19 DIAGNOSIS — N529 Male erectile dysfunction, unspecified: Secondary | ICD-10-CM

## 2023-01-19 DIAGNOSIS — M150 Primary generalized (osteo)arthritis: Secondary | ICD-10-CM

## 2023-01-19 DIAGNOSIS — Z0000 Encounter for general adult medical examination without abnormal findings: Secondary | ICD-10-CM

## 2023-01-19 DIAGNOSIS — R634 Abnormal weight loss: Secondary | ICD-10-CM

## 2023-01-19 DIAGNOSIS — R972 Elevated prostate specific antigen [PSA]: Secondary | ICD-10-CM

## 2023-01-19 MED ORDER — OMEPRAZOLE 40 MG PO CPDR
40.0000 mg | DELAYED_RELEASE_CAPSULE | Freq: Every day | ORAL | 2 refills | Status: DC
Start: 2023-01-19 — End: 2023-04-19

## 2023-01-19 MED ORDER — SUCRALFATE 1 G PO TABS
1.0000 g | ORAL_TABLET | Freq: Three times a day (TID) | ORAL | 2 refills | Status: DC | PRN
Start: 2023-01-19 — End: 2023-07-27

## 2023-01-19 MED ORDER — SILDENAFIL CITRATE 100 MG PO TABS
100.0000 mg | ORAL_TABLET | ORAL | 5 refills | Status: DC | PRN
Start: 2023-01-19 — End: 2023-07-01

## 2023-01-19 NOTE — Patient Instructions (Addendum)
Thank you for coming to the office today.  Start with stomach acid treatment Omeprazole 40mg  daily before breakfast for 1-3 months, may need long term.  Carafate as needed for pain associated with reflux acid, take with meal up to 3 times per day  If not improving or if hernia bump on abdomen / near belly button if this worsens like hernia swelling and pushes out we can refer back to Dr Aleen Campi  Careful with heavy lifting.  Refilled Sildenafil  DUE for FASTING BLOOD WORK (no food or drink after midnight before the lab appointment, only water or coffee without cream/sugar on the morning of)  SCHEDULE "Lab Only" visit in the morning at the clinic for lab draw in 2-3  MONTHS   - Make sure Lab Only appointment is at about 1 week before your next appointment, so that results will be available  For Lab Results, once available within 2-3 days of blood draw, you can can log in to MyChart online to view your results and a brief explanation. Also, we can discuss results at next follow-up visit.   Please schedule a Follow-up Appointment to: Return in about 2 months (around 03/22/2023) for 2-3 months early 8am lab fasting, then 1 week later Annual Physical latest apt of day.  If you have any other questions or concerns, please feel free to call the office or send a message through MyChart. You may also schedule an earlier appointment if necessary.  Additionally, you may be receiving a survey about your experience at our office within a few days to 1 week by e-mail or mail. We value your feedback.  Saralyn Pilar, DO [facility], [place]

## 2023-01-19 NOTE — Progress Notes (Signed)
Subjective:    Patient ID: Kevin [name], male    DOB: [birth year], 82 y.o.   MRN: [id number]  Kevin [name] is a 82 y.o. male presenting on 01/19/2023 for Abdominal Pain (Abdominal pain started about 5 days ago. )  Patient presents for a same day appointment.   HPI  Discussed the use of AI scribe software for clinical note transcription with the patient, who gave verbal consent to proceed.  The patient, with a history of previous inguinal laparoscopic hernia repair, presented with intermittent mid-abdominal pain related to heartburn GERD. Onset within 1 week, he went to [facility] already and they were uncertain to the exact cause.  The pain was noted to improve after eating, leading to the patient's speculation of possible acid reflux or an ulcer. The patient reported a palpable lump in the mid-abdomen, near the site of a previous hernia repair involving mesh placement. The patient denied any associated nausea, vomiting  The patient also reported occasional use of ibuprofen 1-2 x per week, and a regular intake of a significant amount of cheese, which he speculated might be contributing to his symptoms. The patient denied any recent changes in weight, although he expressed a desire to gain weight. He reported a previous attempt to use nutritional shakes for weight gain, which resulted in diarrhea.  The patient also mentioned the use of generic Viagra, taken approximately once every other week. He requested a new prescription for this medication.    Past Surgical History:  Procedure Laterality Date   COLONOSCOPY WITH PROPOFOL N/A 06/05/2015   Procedure: COLONOSCOPY WITH PROPOFOL;  Surgeon: Kieth Brightly, MD;  Location: [facility] ENDOSCOPY;  Service: Endoscopy;  Laterality: N/A;   HERNIA REPAIR Left 03/24/2004   inguinal    HERNIA REPAIR Right 1990   inguinal   XI ROBOTIC ASSISTED INGUINAL HERNIA REPAIR WITH MESH Right 02/20/2020   Procedure: XI ROBOTIC ASSISTED INGUINAL  HERNIA REPAIR WITH MESH, recurrent;  Surgeon: Henrene Dodge, MD;  Location: [facility] ORS;  Service: General;  Laterality: Right;        01/19/2023    4:38 PM 01/23/2022   11:20 AM 04/23/2021    1:54 PM  Depression screen PHQ 2/9  Decreased Interest 3 0 3  Down, Depressed, Hopeless 0 0 0  PHQ - 2 Score 3 0 3  Altered sleeping 0 0 0  Tired, decreased energy 0 0 0  Change in appetite 0 0 0  Feeling bad or failure about yourself  0 0 0  Trouble concentrating 0 0 0  Moving slowly or fidgety/restless 0 0 0  Suicidal thoughts 0 0 0  PHQ-9 Score 3 0 3  Difficult doing work/chores Not difficult at all Not difficult at all Not difficult at all       01/19/2023    4:39 PM 04/23/2021    1:55 PM 12/27/2018    3:50 PM  GAD 7 : Generalized Anxiety Score  Nervous, Anxious, on Edge 0 0 0  Control/stop worrying 0 0 0  Worry too much - different things 0 0 0  Trouble relaxing 0 0 0  Restless 0 0 0  Easily annoyed or irritable 0 0 0  Afraid - awful might happen 0 0 0  Total GAD 7 Score 0 0 0  Anxiety Difficulty  Not difficult at all     Social History   Tobacco Use   Smoking status: Former   Smokeless tobacco: Never  Vaping Use   Vaping status:  Never Used  Substance Use Topics   Alcohol use: Never    Alcohol/week: 0.0 standard drinks of alcohol    Comment: rarely    Drug use: No    Review of Systems Per HPI unless specifically indicated above     Objective:    BP 112/64   Ht 5\' 8"  (1.727 m)   Wt 130 lb (59 kg)   BMI 19.77 kg/m   Wt Readings from Last 3 Encounters:  01/19/23 130 lb (59 kg)  03/11/22 128 lb 12.8 oz (58.4 kg)  01/23/22 146 lb (66.2 kg)    Physical Exam Vitals and nursing note reviewed.  Constitutional:      General: He is not in acute distress.    Appearance: Normal appearance. He is well-developed. He is not diaphoretic.     Comments: Well-appearing, comfortable, cooperative  HENT:     Head: Normocephalic and atraumatic.  Eyes:     General:         Right eye: No discharge.        Left eye: No discharge.     Conjunctiva/sclera: Conjunctivae normal.  Cardiovascular:     Rate and Rhythm: Normal rate.  Pulmonary:     Effort: Pulmonary effort is normal.  Abdominal:     General: Abdomen is flat. Bowel sounds are normal. There is no distension.     Palpations: Abdomen is soft. There is no mass.     Tenderness: There is no abdominal tenderness. There is no guarding.     Hernia: A hernia (Possible mild umbilical hernia without actual herniation on exam.) is present.  Musculoskeletal:     Right lower leg: No edema.     Left lower leg: No edema.  Skin:    General: Skin is warm and dry.     Findings: No erythema or rash.  Neurological:     Mental Status: He is alert and oriented to person, place, and time.  Psychiatric:        Mood and Affect: Mood normal.        Behavior: Behavior normal.        Thought Content: Thought content normal.     Comments: Well groomed, good eye contact, normal speech and thoughts     Results for orders placed or performed in visit on 03/11/22  POC Influenza A&B (Binax test)  Result Value Ref Range   Influenza A, POC Positive (A) Negative   Influenza B, POC Negative Negative  POC COVID-19  Result Value Ref Range   SARS Coronavirus 2 Ag Negative Negative      Assessment & Plan:   Problem List Items Addressed This Visit     Erectile dysfunction   Relevant Medications   sildenafil (VIAGRA) 100 MG tablet   Gastroesophageal reflux disease - Primary   Relevant Medications   omeprazole (PRILOSEC) 40 MG capsule   sucralfate (CARAFATE) 1 g tablet   Other Visit Diagnoses     Umbilical hernia without obstruction and without gangrene             Abdominal Pain vs GERD / PUD Pain localized to the mid-stomach, possibly related to a hernia or acid reflux. Pain improves with eating, suggesting a possible gastric origin. A palpable lump near the belly button, possibly related to previous hernia repair  with mesh placement.  -Start Omeprazole 40mg  daily before breakfast for suspected acid reflux. -Start Carafate as needed for pain associated with reflux, to be taken with meals up to three times  per day.  Possible mild umbilical hernia but the deeper nodular defect palpable is not entirely consistent with actual hernia present. Questionable at this time. -Consider referral to Dr. Aleen Campi if symptoms do not improve or if the abdominal lump worsens.  Erectile Dysfunction Currently managed with Sildenafil (generic Viagra). -Renew prescription for Sildenafil 100mg , to be filled at [facility].  Weight Loss, chronic unintentional Patient has lost weight over the past year and is interested in gaining weight. Review shows he has maintained weight and gained +2 lbs in past 10 months -Provide patient with a list of high-calorie, high-protein foods to help with weight gain.      No orders of the defined types were placed in this encounter.   Meds ordered this encounter  Medications   sildenafil (VIAGRA) 100 MG tablet    Sig: Take 1 tablet (100 mg total) by mouth as needed for erectile dysfunction.    Dispense:  5 tablet    Refill:  5   omeprazole (PRILOSEC) 40 MG capsule    Sig: Take 1 capsule (40 mg total) by mouth daily before breakfast.    Dispense:  30 capsule    Refill:  2   sucralfate (CARAFATE) 1 g tablet    Sig: Take 1 tablet (1 g total) by mouth 3 (three) times daily with meals as needed.    Dispense:  30 tablet    Refill:  2    Follow up plan: Return in about 2 months (around 03/22/2023) for 2-3 months early 8am lab fasting, then 1 week later Annual Physical latest apt of day.  Future labs ordered for 04/12/23   Saralyn Pilar, DO [facility] 01/19/2023, 4:11 PM

## 2023-02-02 ENCOUNTER — Ambulatory Visit: Payer: Self-pay | Admitting: *Deleted

## 2023-02-02 DIAGNOSIS — S61031A Puncture wound without foreign body of right thumb without damage to nail, initial encounter: Secondary | ICD-10-CM | POA: Diagnosis not present

## 2023-02-02 NOTE — Telephone Encounter (Signed)
Reason for Disposition  Puncture wound from a sharp object that was very dirty  Answer Assessment - Initial Assessment Questions 1. LOCATION: "Where is the puncture located?"      Right- thumb 2. OBJECT: "What was the object that punctured the skin?"      nail 3. DEPTH: "How deep do you think the puncture goes?"      Not sure how deep 4. ONSET: "When did the injury occur?" (Minutes or hours)     2 o'clock today 5. PAIN: "Is it painful?" If Yes, ask: "How bad is the pain?"  (Scale 1-10; or mild, moderate, severe)     mild 6. TETANUS: "When was the last tetanus booster?"     years  Protocols used: Puncture Wound-A-AH

## 2023-02-02 NOTE — Telephone Encounter (Signed)
  Chief Complaint: puncture wound- thumb- patient requesting tetanus shot-now Symptoms: puncture wound- rusty nail- not sure how deep- did have bleeding. Frequency: puncture today 2pm  Disposition: [] ED /[x] Urgent Care (no appt availability in office) / [] Appointment(In office/virtual)/ []  [place] [facility]/ [] Home Care/ [] Refused Recommended Disposition /[] [place] [facility]/ []  Follow-up with PCP Additional Notes: Patient is requesting appointment today- no open appointment- UC advised

## 2023-03-30 DIAGNOSIS — H02815 Retained foreign body in left lower eyelid: Secondary | ICD-10-CM | POA: Diagnosis not present

## 2023-04-12 ENCOUNTER — Other Ambulatory Visit: Payer: Self-pay

## 2023-04-12 DIAGNOSIS — R7309 Other abnormal glucose: Secondary | ICD-10-CM

## 2023-04-12 DIAGNOSIS — R634 Abnormal weight loss: Secondary | ICD-10-CM

## 2023-04-12 DIAGNOSIS — Z0000 Encounter for general adult medical examination without abnormal findings: Secondary | ICD-10-CM

## 2023-04-12 DIAGNOSIS — R972 Elevated prostate specific antigen [PSA]: Secondary | ICD-10-CM

## 2023-04-12 DIAGNOSIS — M150 Primary generalized (osteo)arthritis: Secondary | ICD-10-CM

## 2023-04-18 ENCOUNTER — Other Ambulatory Visit: Payer: Self-pay | Admitting: Family Medicine

## 2023-04-18 DIAGNOSIS — K219 Gastro-esophageal reflux disease without esophagitis: Secondary | ICD-10-CM

## 2023-04-19 NOTE — Telephone Encounter (Signed)
 Requested Prescriptions  Pending Prescriptions Disp Refills   omeprazole (PRILOSEC) 40 MG capsule [Pharmacy Med Name: OMEPRAZOLE DR 40 MG CAPSULE] 30 capsule 0    Sig: TAKE 1 CAPSULE BY MOUTH EVERY DAY BEFORE BREAKFAST     Gastroenterology: Proton Pump Inhibitors Failed - 04/19/2023  4:27 PM      Failed - Valid encounter within last 12 months    Recent Outpatient Visits           3 months ago Gastroesophageal reflux disease without esophagitis   [place] [facility] [place], Netta Neat, DO   1 year ago Influenza A   [place] [facility] Smitty Cords, DO   1 year ago Acute non-recurrent frontal sinusitis   [place] [facility] Smitty Cords, DO   2 years ago Eczema, unspecified type   [facility] Smitty Cords, DO   2 years ago Bilateral impacted cerumen   [place] [facility] [place], Netta Neat, DO       Future Appointments             Tomorrow Althea Charon, Netta Neat, DO  [facility], [facility]

## 2023-04-20 ENCOUNTER — Encounter: Payer: Self-pay | Admitting: Family Medicine

## 2023-05-03 ENCOUNTER — Other Ambulatory Visit: Payer: Self-pay | Admitting: Family Medicine

## 2023-05-03 DIAGNOSIS — K219 Gastro-esophageal reflux disease without esophagitis: Secondary | ICD-10-CM

## 2023-05-04 NOTE — Telephone Encounter (Signed)
 Requested medications are due for refill today.  yes  Requested medications are on the active medications list.  yes  Last refill. 04/19/2023  Future visit scheduled.   no  Notes to clinic.  Pt has no showed for a few recent appts. Please advise.     Requested Prescriptions  Pending Prescriptions Disp Refills   omeprazole (PRILOSEC) 40 MG capsule [Pharmacy Med Name: OMEPRAZOLE DR 40 MG CAPSULE] 90 capsule 1    Sig: TAKE 1 CAPSULE BY MOUTH EVERY DAY BEFORE BREAKFAST     Gastroenterology: Proton Pump Inhibitors Failed - 05/04/2023  2:18 PM      Failed - Valid encounter within last 12 months    Recent Outpatient Visits           3 months ago Gastroesophageal reflux disease without esophagitis   [place] [facility] Smitty Cords, DO   1 year ago Influenza A   [place] [facility] Smitty Cords, DO   2 years ago Acute non-recurrent frontal sinusitis   [place] [facility] Smitty Cords, DO   2 years ago Eczema, unspecified type   [facility] Smitty Cords, DO   3 years ago Bilateral impacted cerumen   [place] [facility] [place], Netta Neat, [place]

## 2023-05-17 ENCOUNTER — Other Ambulatory Visit: Payer: Self-pay | Admitting: Family Medicine

## 2023-05-17 DIAGNOSIS — J101 Influenza due to other identified influenza virus with other respiratory manifestations: Secondary | ICD-10-CM

## 2023-05-19 NOTE — Telephone Encounter (Signed)
 Needs an appointment.

## 2023-05-19 NOTE — Telephone Encounter (Signed)
 Requested medication (s) are due for refill today: No  Requested medication (s) are on the active medication list: Yes  Last refill:  03/11/22  Future visit scheduled: No  Notes to clinic:  Manual review.    Requested Prescriptions  Pending Prescriptions Disp Refills   ipratropium (ATROVENT) 0.06 % nasal spray [Pharmacy Med Name: IPRATROPIUM 0.06% SPRAY]      Sig: PLACE 2 SPRAYS INTO BOTH NOSTRILS 4 TIMES DAILY. FOR UP TO 5-7 DAYS THEN STOP.     Off-Protocol Failed - 05/19/2023  1:37 PM      Failed - Medication not assigned to a protocol, review manually.      Failed - Valid encounter within last 12 months    Recent Outpatient Visits   None           Off-Protocol Failed - 05/19/2023  1:37 PM      Failed - Medication not assigned to a protocol, review manually.      Failed - Valid encounter within last 12 months    Recent Outpatient Visits   None

## 2023-06-02 ENCOUNTER — Other Ambulatory Visit: Payer: Self-pay | Admitting: Family Medicine

## 2023-06-02 DIAGNOSIS — J101 Influenza due to other identified influenza virus with other respiratory manifestations: Secondary | ICD-10-CM

## 2023-06-03 NOTE — Telephone Encounter (Signed)
 Requested medication (s) are due for refill today:   Yes  Requested medication (s) are on the active medication list:   Yes  Future visit scheduled:   Yes tomorrow 4/18   LOV 01/19/2023   Last ordered: 03/11/2022 15 ml, 0 refills  No protocol assigned this medication   Requested Prescriptions  Pending Prescriptions Disp Refills   ipratropium (ATROVENT) 0.06 % nasal spray [Pharmacy Med Name: IPRATROPIUM 0.06% SPRAY]      Sig: PLACE 2 SPRAYS INTO BOTH NOSTRILS 4 TIMES DAILY. FOR UP TO 5-7 DAYS THEN STOP.     Off-Protocol Failed - 06/03/2023  1:45 PM      Failed - Medication not assigned to a protocol, review manually.      Failed - Valid encounter within last 12 months    Recent Outpatient Visits   None           Off-Protocol Failed - 06/03/2023  1:45 PM      Failed - Medication not assigned to a protocol, review manually.      Failed - Valid encounter within last 12 months    Recent Outpatient Visits   None

## 2023-06-04 ENCOUNTER — Ambulatory Visit: Admitting: Family Medicine

## 2023-06-28 ENCOUNTER — Ambulatory Visit: Payer: Self-pay

## 2023-06-28 NOTE — Telephone Encounter (Signed)
 Copied from CRM [id number]. Topic: Clinical - Red Word Triage >> Jun 28, 2023  1:18 PM Elle L wrote: Red Word that prompted transfer to Nurse Triage: The patient has severe pain in his right calf.   Chief Complaint: Calf pain Symptoms: Right calf pain Frequency: Constant  Pertinent Negatives: Patient denies chest pain or shortness of breath  Disposition: [] ED /[x] Urgent Care (no appt availability in office) / [] Appointment(In office/virtual)/ []  [place] [facility]/ [] Home Care/ [] Refused Recommended Disposition /[] [place] [facility]/ []  Follow-up with PCP Additional Notes: Patient reports that he has had right calf pain for the last 2-3 days. He states his pain is constant and rates it as 7/10. Patient states he has been wearing a knee brace and is unsure if that could be causing his pain. Patient advised there are no appointments available today and that he should go to urgent care or the ED for evaluation due to his symptoms being concerning for a DVT. Patient verbalized understanding and agreement of this plan.    Reason for Disposition  [1] Thigh or calf pain AND [2] only 1 side AND [3] present > 1 hour (Exception: Chronic unchanged pain.)  Answer Assessment - Initial Assessment Questions 1. ONSET: "When did the pain start?"      2-3 days 2. LOCATION: "Where is the pain located?"      Right calf 3. PAIN: "How bad is the pain?"    (Scale 1-10; or mild, moderate, severe)   -  MILD (1-3): doesn't interfere with normal activities    -  MODERATE (4-7): interferes with normal activities (e.g., work or school) or awakens from sleep, limping    -  SEVERE (8-10): excruciating pain, unable to do any normal activities, unable to walk     7/10 4. WORK OR EXERCISE: "Has there been any recent work or exercise that involved this part of the body?"      No 5. CAUSE: "What do you think is causing the leg pain?"     Was wearing brace due to his knee  6. OTHER SYMPTOMS: "Do you have any other  symptoms?" (e.g., chest pain, back pain, breathing difficulty, swelling, rash, fever, numbness, weakness)     No  Protocols used: Leg Pain-A-AH

## 2023-06-29 ENCOUNTER — Other Ambulatory Visit: Payer: Self-pay | Admitting: Family Medicine

## 2023-06-29 DIAGNOSIS — M150 Primary generalized (osteo)arthritis: Secondary | ICD-10-CM

## 2023-06-29 DIAGNOSIS — K219 Gastro-esophageal reflux disease without esophagitis: Secondary | ICD-10-CM

## 2023-06-30 NOTE — Telephone Encounter (Signed)
 Requested medication (s) are due for refill today -yes  Requested medication (s) are on the active medication list -yes  Future visit scheduled -yes  Last refill: 07/27/22 #90 2RF  Notes to clinic: fails lab protocol- over 1 year- 09/26/19  Requested Prescriptions  Pending Prescriptions Disp Refills   ibuprofen  (ADVIL ) 600 MG tablet [Pharmacy Med Name: IBUPROFEN  600 MG TABLET] 90 tablet 2    Sig: TAKE 1 TABLET BY MOUTH EVERY 8 HOURS AS NEEDED     Analgesics:  NSAIDS Failed - 06/30/2023  2:40 PM      Failed - Manual Review: Labs are only required if the patient has taken medication for more than 8 weeks.      Failed - Cr in normal range and within 360 days    Creat  Date Value Ref Range Status  09/26/2019 0.99 0.70 - 1.18 mg/dL Final    Comment:    For patients >25 years of age, the reference limit for Creatinine is approximately 13% higher for people identified as African-American. .          Failed - HGB in normal range and within 360 days    Hemoglobin  Date Value Ref Range Status  09/26/2019 13.1 (L) 13.2 - 17.1 g/dL Final  78/29/5621 30.8 12.6 - 17.7 g/dL Final         Failed - PLT in normal range and within 360 days    Platelets  Date Value Ref Range Status  09/26/2019 192 140 - 400 Thousand/uL Final  05/29/2015 233 150 - 379 x10E3/uL Final         Failed - HCT in normal range and within 360 days    HCT  Date Value Ref Range Status  09/26/2019 42.5 38.5 - 50.0 % Final   Hematocrit  Date Value Ref Range Status  05/29/2015 41.0 37.5 - 51.0 % Final         Failed - eGFR is 30 or above and within 360 days    GFR, Est African American  Date Value Ref Range Status  09/26/2019 85 > OR = 60 mL/min/1.20m2 Final   GFR, Est Non African American  Date Value Ref Range Status  09/26/2019 73 > OR = 60 mL/min/1.21m2 Final         Failed - Valid encounter within last 12 months    Recent Outpatient Visits   None            Passed - Patient is not pregnant       Refused Prescriptions Disp Refills   omeprazole  (PRILOSEC) 40 MG capsule [Pharmacy Med Name: OMEPRAZOLE  DR 40 MG CAPSULE] 30 capsule 0    Sig: TAKE 1 CAPSULE BY MOUTH EVERY DAY BEFORE BREAKFAST     Gastroenterology: Proton Pump Inhibitors Failed - 06/30/2023  2:40 PM      Failed - Valid encounter within last 12 months    Recent Outpatient Visits   None               Requested Prescriptions  Pending Prescriptions Disp Refills   ibuprofen  (ADVIL ) 600 MG tablet [Pharmacy Med Name: IBUPROFEN  600 MG TABLET] 90 tablet 2    Sig: TAKE 1 TABLET BY MOUTH EVERY 8 HOURS AS NEEDED     Analgesics:  NSAIDS Failed - 06/30/2023  2:40 PM      Failed - Manual Review: Labs are only required if the patient has taken medication for more than 8 weeks.      Failed -  Cr in normal range and within 360 days    Creat  Date Value Ref Range Status  09/26/2019 0.99 0.70 - 1.18 mg/dL Final    Comment:    For patients >50 years of age, the reference limit for Creatinine is approximately 13% higher for people identified as African-American. .          Failed - HGB in normal range and within 360 days    Hemoglobin  Date Value Ref Range Status  09/26/2019 13.1 (L) 13.2 - 17.1 g/dL Final  14/78/2956 21.3 12.6 - 17.7 g/dL Final         Failed - PLT in normal range and within 360 days    Platelets  Date Value Ref Range Status  09/26/2019 192 140 - 400 Thousand/uL Final  05/29/2015 233 150 - 379 x10E3/uL Final         Failed - HCT in normal range and within 360 days    HCT  Date Value Ref Range Status  09/26/2019 42.5 38.5 - 50.0 % Final   Hematocrit  Date Value Ref Range Status  05/29/2015 41.0 37.5 - 51.0 % Final         Failed - eGFR is 30 or above and within 360 days    GFR, Est African American  Date Value Ref Range Status  09/26/2019 85 > OR = 60 mL/min/1.48m2 Final   GFR, Est Non African American  Date Value Ref Range Status  09/26/2019 73 > OR = 60 mL/min/1.52m2 Final          Failed - Valid encounter within last 12 months    Recent Outpatient Visits   None            Passed - Patient is not pregnant      Refused Prescriptions Disp Refills   omeprazole  (PRILOSEC) 40 MG capsule [Pharmacy Med Name: OMEPRAZOLE  DR 40 MG CAPSULE] 30 capsule 0    Sig: TAKE 1 CAPSULE BY MOUTH EVERY DAY BEFORE BREAKFAST     Gastroenterology: Proton Pump Inhibitors Failed - 06/30/2023  2:40 PM      Failed - Valid encounter within last 12 months    Recent Outpatient Visits   None

## 2023-06-30 NOTE — Telephone Encounter (Signed)
 Omeprazole - patient has had courtesy refill- did not keep appointment Requested Prescriptions  Pending Prescriptions Disp Refills   omeprazole  (PRILOSEC) 40 MG capsule [Pharmacy Med Name: OMEPRAZOLE  DR 40 MG CAPSULE] 30 capsule 0    Sig: TAKE 1 CAPSULE BY MOUTH EVERY DAY BEFORE BREAKFAST     Gastroenterology: Proton Pump Inhibitors Failed - 06/30/2023  2:40 PM      Failed - Valid encounter within last 12 months    Recent Outpatient Visits   None             ibuprofen  (ADVIL ) 600 MG tablet [Pharmacy Med Name: IBUPROFEN  600 MG TABLET] 90 tablet 2    Sig: TAKE 1 TABLET BY MOUTH EVERY 8 HOURS AS NEEDED     Analgesics:  NSAIDS Failed - 06/30/2023  2:40 PM      Failed - Manual Review: Labs are only required if the patient has taken medication for more than 8 weeks.      Failed - Cr in normal range and within 360 days    Creat  Date Value Ref Range Status  09/26/2019 0.99 0.70 - 1.18 mg/dL Final    Comment:    For patients >15 years of age, the reference limit for Creatinine is approximately 13% higher for people identified as African-American. .          Failed - HGB in normal range and within 360 days    Hemoglobin  Date Value Ref Range Status  09/26/2019 13.1 (L) 13.2 - 17.1 g/dL Final  16/11/9602 54.0 12.6 - 17.7 g/dL Final         Failed - PLT in normal range and within 360 days    Platelets  Date Value Ref Range Status  09/26/2019 192 140 - 400 Thousand/uL Final  05/29/2015 233 150 - 379 x10E3/uL Final         Failed - HCT in normal range and within 360 days    HCT  Date Value Ref Range Status  09/26/2019 42.5 38.5 - 50.0 % Final   Hematocrit  Date Value Ref Range Status  05/29/2015 41.0 37.5 - 51.0 % Final         Failed - eGFR is 30 or above and within 360 days    GFR, Est African American  Date Value Ref Range Status  09/26/2019 85 > OR = 60 mL/min/1.10m2 Final   GFR, Est Non African American  Date Value Ref Range Status  09/26/2019 73 > OR = 60  mL/min/1.6m2 Final         Failed - Valid encounter within last 12 months    Recent Outpatient Visits   None            Passed - Patient is not pregnant

## 2023-07-01 ENCOUNTER — Encounter: Payer: Self-pay | Admitting: Family Medicine

## 2023-07-01 ENCOUNTER — Ambulatory Visit (INDEPENDENT_AMBULATORY_CARE_PROVIDER_SITE_OTHER): Admitting: Family Medicine

## 2023-07-01 ENCOUNTER — Other Ambulatory Visit: Payer: Self-pay | Admitting: Family Medicine

## 2023-07-01 ENCOUNTER — Ambulatory Visit
Admission: RE | Admit: 2023-07-01 | Discharge: 2023-07-01 | Disposition: A | Discharge status: Home / Self Care | Source: Ambulatory Visit | Attending: Family Medicine | Admitting: Family Medicine

## 2023-07-01 ENCOUNTER — Telehealth: Payer: Self-pay

## 2023-07-01 ENCOUNTER — Ambulatory Visit: Payer: Self-pay | Admitting: Family Medicine

## 2023-07-01 VITALS — BP 122/70 | HR 71 | Ht 68.0 in | Wt 131.1 lb

## 2023-07-01 DIAGNOSIS — M25561 Pain in right knee: Secondary | ICD-10-CM

## 2023-07-01 DIAGNOSIS — J0110 Acute frontal sinusitis, unspecified: Secondary | ICD-10-CM

## 2023-07-01 DIAGNOSIS — L309 Dermatitis, unspecified: Secondary | ICD-10-CM

## 2023-07-01 DIAGNOSIS — R7989 Other specified abnormal findings of blood chemistry: Secondary | ICD-10-CM

## 2023-07-01 DIAGNOSIS — N529 Male erectile dysfunction, unspecified: Secondary | ICD-10-CM

## 2023-07-01 DIAGNOSIS — M150 Primary generalized (osteo)arthritis: Secondary | ICD-10-CM

## 2023-07-01 DIAGNOSIS — M79661 Pain in right lower leg: Secondary | ICD-10-CM | POA: Insufficient documentation

## 2023-07-01 DIAGNOSIS — R7309 Other abnormal glucose: Secondary | ICD-10-CM

## 2023-07-01 DIAGNOSIS — G8929 Other chronic pain: Secondary | ICD-10-CM

## 2023-07-01 DIAGNOSIS — R972 Elevated prostate specific antigen [PSA]: Secondary | ICD-10-CM

## 2023-07-01 DIAGNOSIS — R634 Abnormal weight loss: Secondary | ICD-10-CM

## 2023-07-01 DIAGNOSIS — Z0000 Encounter for general adult medical examination without abnormal findings: Secondary | ICD-10-CM

## 2023-07-01 MED ORDER — FLUTICASONE PROPIONATE 50 MCG/ACT NA SUSP: 2.0000 | Freq: Every day | NASAL | 3 refills | Status: DC

## 2023-07-01 MED ORDER — DICLOFENAC SODIUM 1 % EX GEL: CUTANEOUS | 2 refills | Status: AC

## 2023-07-01 MED ORDER — TRIAMCINOLONE ACETONIDE 0.5 % EX CREA: 1.0000 | TOPICAL_CREAM | Freq: Two times a day (BID) | CUTANEOUS | 5 refills | Status: DC

## 2023-07-01 MED ORDER — SILDENAFIL CITRATE 100 MG PO TABS: 100.0000 mg | ORAL_TABLET | ORAL | 5 refills | Status: AC | PRN

## 2023-07-01 NOTE — Progress Notes (Signed)
 Subjective:    Patient ID: Kevin [name], male    DOB: Aug 12, 1940, 83 y.o.   MRN: [id number]  LYLE WEYER is a 83 y.o. male presenting on 07/01/2023 for Right Lower Extremity Pain / Knee / Calf    HPI  Discussed the use of AI scribe software for clinical note transcription with the patient, who gave verbal consent to proceed.  History of Present Illness   Kevin [name] is an 83 year old male who presents with right lower leg pain for ER follow-up.  ED visit at [facility] 3 days ago on 06/28/23. For pain in his right lower leg, specifically below the knee, in calf area. He had D-Dimer lab showed abnormal 559, and he left without being seen by provider. No further imaging or testing or evaluation given. He left due to long wait time.  The pain is described as aching and sometimes severe enough to impede walking, particularly upon waking in the morning. It can feel like muscle cramping, especially on Sunday when he had difficulty walking. The pain is located almost behind the knee and extends down the leg. He has been using a knee brace without relief and has not worn it for the past three to four days. The pain is inconsistent, sometimes improving after a hot shower and walking at work. Ibuprofen  600 mg has been helpful for pain relief.  His past medical history includes knee pain, which can cause discomfort when turning, and a history of elevated prostate numbers, which had decreased slightly at the last check. He is currently using Flonase  for nasal congestion and has requested refills for this and a topical muscle rub for arthritis. He takes Viagra  once a week and prefers to avoid muscle relaxers due to concerns about side effects. He has not had recent blood work, although there were orders from February that were not completed. He plans to have a wellness exam and blood work in early June.      07/01/2023   11:35 AM 01/19/2023    4:38 PM 01/23/2022   11:20 AM  Depression screen  PHQ 2/9  Decreased Interest 3 3 0  Down, Depressed, Hopeless 0 0 0  PHQ - 2 Score 3 3 0  Altered sleeping 0 0 0  Tired, decreased energy 0 0 0  Change in appetite 0 0 0  Feeling bad or failure about yourself  0 0 0  Trouble concentrating 0 0 0  Moving slowly or fidgety/restless 0 0 0  Suicidal thoughts 0 0 0  PHQ-9 Score 3 3 0  Difficult doing work/chores Not difficult at all Not difficult at all Not difficult at all       07/01/2023   11:36 AM 01/19/2023    4:39 PM 04/23/2021    1:55 PM 12/27/2018    3:50 PM  GAD 7 : Generalized Anxiety Score  Nervous, Anxious, on Edge 0 0 0 0  Control/stop worrying 0 0 0 0  Worry too much - different things 0 0 0 0  Trouble relaxing 0 0 0 0  Restless 0 0 0 0  Easily annoyed or irritable 0 0 0 0  Afraid - awful might happen 0 0 0 0  Total GAD 7 Score 0 0 0 0  Anxiety Difficulty Not difficult at all  Not difficult at all     Social History   Tobacco Use   Smoking status: Former   Smokeless tobacco: Never  Advertising account planner  Vaping status: Never Used  Substance Use Topics   Alcohol use: Never    Alcohol/week: 0.0 standard drinks of alcohol    Comment: rarely    Drug use: No    Review of Systems Per HPI unless specifically indicated above     Objective:     BP 122/70 (BP Location: Right Arm, Patient Position: Sitting, Cuff Size: Normal)   Pulse 71   Ht 5\' 8"  (1.727 m)   Wt 131 lb 2 oz (59.5 kg)   SpO2 100%   BMI 19.94 kg/m   Wt Readings from Last 3 Encounters:  07/01/23 131 lb 2 oz (59.5 kg)  01/19/23 130 lb (59 kg)  03/11/22 128 lb 12.8 oz (58.4 kg)    Physical Exam Vitals and nursing note reviewed.  Constitutional:      General: He is not in acute distress.    Appearance: Normal appearance. He is well-developed. He is not diaphoretic.     Comments: Well-appearing, comfortable, cooperative  HENT:     Head: Normocephalic and atraumatic.  Eyes:     General:        Right eye: No discharge.        Left eye: No discharge.      Conjunctiva/sclera: Conjunctivae normal.  Cardiovascular:     Rate and Rhythm: Normal rate.  Pulmonary:     Effort: Pulmonary effort is normal.  Musculoskeletal:     Right lower leg: No edema.     Left lower leg: No edema.     Comments: Right lower extremity with R knee some crepitus, has full range of motion. Able to weight bear.  R calf is non tender, no swelling, no erythema, no warmth, compared to L side it is symmetrical.  Skin:    General: Skin is warm and dry.     Findings: No erythema or rash.  Neurological:     Mental Status: He is alert and oriented to person, place, and time.  Psychiatric:        Mood and Affect: Mood normal.        Behavior: Behavior normal.        Thought Content: Thought content normal.     Comments: Well groomed, good eye contact, normal speech and thoughts      I have personally reviewed the radiology report from 07/01/23 on STAT Doppler R Lower Extremity.  CLINICAL DATA:  Right calf pain   EXAM: Right LOWER EXTREMITY VENOUS DOPPLER ULTRASOUND   TECHNIQUE: Gray-scale sonography with graded compression, as well as color Doppler and duplex ultrasound were performed to evaluate the lower extremity deep venous systems from the level of the common femoral vein and including the common femoral, femoral, profunda femoral, popliteal and calf veins including the posterior tibial, peroneal and gastrocnemius veins when visible. The superficial great saphenous vein was also interrogated. Spectral Doppler was utilized to evaluate flow at rest and with distal augmentation maneuvers in the common femoral, femoral and popliteal veins.   COMPARISON:  None Available.   FINDINGS: Contralateral Common Femoral Vein: Respiratory phasicity is normal and symmetric with the symptomatic side. No evidence of thrombus. Normal compressibility.   Common Femoral Vein: No evidence of thrombus. Normal compressibility, respiratory phasicity and response to  augmentation.   Saphenofemoral Junction: No evidence of thrombus. Normal compressibility and flow on color Doppler imaging.   Profunda Femoral Vein: No evidence of thrombus. Normal compressibility and flow on color Doppler imaging.   Femoral Vein: No evidence of thrombus. Normal compressibility,  respiratory phasicity and response to augmentation.   Popliteal Vein: No evidence of thrombus. Normal compressibility, respiratory phasicity and response to augmentation.   Calf Veins: No evidence of thrombus. Normal compressibility and flow on color Doppler imaging.   Superficial Great Saphenous Vein: No evidence of thrombus. Normal compressibility.   Venous Reflux:  None.   Other Findings:  None.   IMPRESSION: No evidence of right lower extremity DVT.     Electronically Signed   By: Adrianna Horde M.D.   On: 07/01/2023 15:52  Results for orders placed or performed in visit on 03/11/22  POC COVID-19   Collection Time: 03/11/22 10:13 AM  Result Value Ref Range   SARS Coronavirus 2 Ag Negative Negative  POC Influenza A&B (Binax test)   Collection Time: 03/11/22 10:13 AM  Result Value Ref Range   Influenza A, POC Positive (A) Negative   Influenza B, POC Negative Negative      Assessment & Plan:   Problem List Items Addressed This Visit     Erectile dysfunction   Relevant Medications   sildenafil  (VIAGRA ) 100 MG tablet   Primary osteoarthritis involving multiple joints   Relevant Medications   diclofenac  Sodium (VOLTAREN ) 1 % GEL   Other Visit Diagnoses       Right calf pain    -  Primary     D-dimer, elevated         Acute non-recurrent frontal sinusitis       Relevant Medications   fluticasone  (FLONASE ) 50 MCG/ACT nasal spray     Eczema, unspecified type       Relevant Medications   triamcinolone  cream (KENALOG ) 0.5 %     Chronic pain of right knee            Right lower leg pain Intermittent pain below the knee and calf area, however physical exam without  asymmetry edema or erythema or palpable pain, low risk for blood clot DVT, HOWEVER He had recent ED visit 06/28/23, 3 days ago at [facility] ED, see HPI. He had significantly abnormal D-Dimer at that time 559, and he left ED against medical advice without being seen by provider. Did not have imaging or other testing or diagnosis / evaluation.  I advised him today that given this abnormal result, there was significant concern for DVT, however his exam was reassuring. - Order STAT Doppler US  scan to rule out blood clot - Right lower extremity - Called results to patient this afternoon, his scan was for 3pm, results sent to patient 430pm today. Notified him of negative DVT result. Seems likely MSK pain now with knee as described, treat as arthritis and joint pain.  - Continue ibuprofen  600 mg as needed. - Reorder topical muscle rub. - Advise ice and heat therapy.  He will return in a few weeks for routine labs and follow-up, as he has not been following up with lab orders as we have requested in past.   No orders of the defined types were placed in this encounter.    Meds ordered this encounter  Medications   diclofenac  Sodium (VOLTAREN ) 1 % GEL    Sig: APPLY 2 GRAMS TOPICALLY TO THE AFFECTED AREA FOUR TIMES DAILY    Dispense:  100 g    Refill:  2   fluticasone  (FLONASE ) 50 MCG/ACT nasal spray    Sig: Place 2 sprays into both nostrils daily. Use for 4-6 weeks then stop and use seasonally or as needed.    Dispense:  16 g    Refill:  3   triamcinolone  cream (KENALOG ) 0.5 %    Sig: Apply 1 Application topically 2 (two) times daily. To affected areas, for up to 2 weeks.    Dispense:  30 g    Refill:  5   sildenafil  (VIAGRA ) 100 MG tablet    Sig: Take 1 tablet (100 mg total) by mouth as needed for erectile dysfunction.    Dispense:  5 tablet    Refill:  5    Follow up plan: Return in about 3 weeks (around 07/22/2023) for 3 weeks fasting lab then few days later Annual  Physical.  Future labs ordered for 07/22/23   Domingo Friend, DO [facility] 07/01/2023, 11:52 AM

## 2023-07-01 NOTE — Telephone Encounter (Signed)
 Copied from CRM [id number]. Topic: Clinical - Lab/Test Results >> Jul 01, 2023  4:24 PM Fonda T wrote: Reason for CRM: Patient returning call regarding STAT lab results.  Contacted office, warm transferred to patient PCP provider.

## 2023-07-01 NOTE — Patient Instructions (Addendum)
 Thank you for coming to the office today.  [facility]   [facility]  Address: [address] [place], [place], [place] [address] Phone: [phone]  --------------------   For the Right leg it could be muscle or knee pain.  Tylenol , Ibuprofen  Re ordered topical  START anti inflammatory topical - OTC Voltaren  (generic Diclofenac ) topical 2-4 times a day as needed for pain swelling of affected joint for 1-2 weeks or longer.  Ice / heat is good  I don't see sign of swelling for blood clot but your test was abnormal back on Monday 5/12  Scan today for ruling out blood clot.  DUE for FASTING BLOOD WORK (no food or drink after midnight before the lab appointment, only water or coffee without cream/sugar on the morning of)  Labs 2-3 weeks   Please schedule a Follow-up Appointment to: Return in about 3 weeks (around 07/22/2023) for 3 weeks fasting lab then few days later Annual Physical.  If you have any other questions or concerns, please feel free to call the office or send a message through MyChart. You may also schedule an earlier appointment if necessary.  Additionally, you may be receiving a survey about your experience at our office within a few days to 1 week by e-mail or mail. We value your feedback.  Domingo Friend, DO [facility], [place]

## 2023-07-22 ENCOUNTER — Other Ambulatory Visit

## 2023-07-22 DIAGNOSIS — R7309 Other abnormal glucose: Secondary | ICD-10-CM

## 2023-07-22 DIAGNOSIS — M150 Primary generalized (osteo)arthritis: Secondary | ICD-10-CM

## 2023-07-22 DIAGNOSIS — R634 Abnormal weight loss: Secondary | ICD-10-CM

## 2023-07-22 DIAGNOSIS — Z0000 Encounter for general adult medical examination without abnormal findings: Secondary | ICD-10-CM

## 2023-07-22 DIAGNOSIS — R972 Elevated prostate specific antigen [PSA]: Secondary | ICD-10-CM

## 2023-07-23 LAB — LIPID PANEL
Cholesterol: 173 mg/dL (ref <200)
HDL: 56 mg/dL (ref >=40)
LDL Cholesterol (Calc): 103 mg/dL — ABNORMAL HIGH
Non-HDL Cholesterol (Calc): 117 mg/dL (ref <130)
Total CHOL/HDL Ratio: 3.1 (calc) (ref <5.0)
Triglycerides: 49 mg/dL (ref <150)

## 2023-07-23 LAB — CBC WITH DIFFERENTIAL/PLATELET
Absolute Lymphocytes: 1209 {cells}/uL (ref 850–3900)
Absolute Monocytes: 360 {cells}/uL (ref 200–950)
Basophils Absolute: 19 {cells}/uL (ref 0–200)
Basophils Relative: 0.6 %
Eosinophils Absolute: 50 {cells}/uL (ref 15–500)
Eosinophils Relative: 1.6 %
HCT: 38.4 % — ABNORMAL LOW (ref 38.5–50.0)
Hemoglobin: 11.7 g/dL — ABNORMAL LOW (ref 13.2–17.1)
MCH: 26.5 pg — ABNORMAL LOW (ref 27.0–33.0)
MCHC: 30.5 g/dL — ABNORMAL LOW (ref 32.0–36.0)
MCV: 87.1 fL (ref 80.0–100.0)
MPV: 10.9 fL (ref 7.5–12.5)
Monocytes Relative: 11.6 %
Neutro Abs: 1463 {cells}/uL — ABNORMAL LOW (ref 1500–7800)
Neutrophils Relative %: 47.2 %
Platelets: 218 10*3/uL (ref 140–400)
RBC: 4.41 10*6/uL (ref 4.20–5.80)
RDW: 12.3 % (ref 11.0–15.0)
Total Lymphocyte: 39 %
WBC: 3.1 10*3/uL — ABNORMAL LOW (ref 3.8–10.8)

## 2023-07-23 LAB — COMPREHENSIVE METABOLIC PANEL WITH GFR
AG Ratio: 1.3 (calc) (ref 1.0–2.5)
ALT: 11 U/L (ref 9–46)
AST: 17 U/L (ref 10–35)
Albumin: 4 g/dL (ref 3.6–5.1)
Alkaline phosphatase (APISO): 47 U/L (ref 35–144)
BUN: 14 mg/dL (ref 7–25)
CO2: 28 mmol/L (ref 20–32)
Calcium: 9.4 mg/dL (ref 8.6–10.3)
Chloride: 104 mmol/L (ref 98–110)
Creat: 0.93 mg/dL (ref 0.70–1.22)
Globulin: 3.2 g/dL (ref 1.9–3.7)
Glucose, Bld: 83 mg/dL (ref 65–99)
Potassium: 4 mmol/L (ref 3.5–5.3)
Sodium: 140 mmol/L (ref 135–146)
Total Bilirubin: 0.7 mg/dL (ref 0.2–1.2)
Total Protein: 7.2 g/dL (ref 6.1–8.1)
eGFR: 82 mL/min/{1.73_m2} (ref >=60)

## 2023-07-23 LAB — HEMOGLOBIN A1C
Hgb A1c MFr Bld: 5.5 % (ref <5.7)
Mean Plasma Glucose: 111 mg/dL
eAG (mmol/L): 6.2 mmol/L

## 2023-07-23 LAB — PSA: PSA: 5.17 ng/mL — ABNORMAL HIGH (ref <=4.00)

## 2023-07-27 ENCOUNTER — Encounter: Payer: Self-pay | Admitting: Family Medicine

## 2023-07-27 ENCOUNTER — Ambulatory Visit (INDEPENDENT_AMBULATORY_CARE_PROVIDER_SITE_OTHER): Admitting: Family Medicine

## 2023-07-27 VITALS — BP 124/78 | HR 77 | Ht 68.0 in | Wt 128.5 lb

## 2023-07-27 DIAGNOSIS — R972 Elevated prostate specific antigen [PSA]: Secondary | ICD-10-CM

## 2023-07-27 DIAGNOSIS — M150 Primary generalized (osteo)arthritis: Secondary | ICD-10-CM | POA: Diagnosis not present

## 2023-07-27 DIAGNOSIS — Z23 Encounter for immunization: Secondary | ICD-10-CM | POA: Diagnosis not present

## 2023-07-27 DIAGNOSIS — J0110 Acute frontal sinusitis, unspecified: Secondary | ICD-10-CM

## 2023-07-27 DIAGNOSIS — D649 Anemia, unspecified: Secondary | ICD-10-CM

## 2023-07-27 DIAGNOSIS — Z0000 Encounter for general adult medical examination without abnormal findings: Secondary | ICD-10-CM

## 2023-07-27 DIAGNOSIS — K219 Gastro-esophageal reflux disease without esophagitis: Secondary | ICD-10-CM

## 2023-07-27 MED ORDER — OMEPRAZOLE 40 MG PO CPDR: 40.0000 mg | DELAYED_RELEASE_CAPSULE | Freq: Every day | ORAL | 1 refills | Status: AC

## 2023-07-27 MED ORDER — FLUTICASONE PROPIONATE 50 MCG/ACT NA SUSP: 2.0000 | Freq: Every day | NASAL | 3 refills | Status: DC

## 2023-07-27 NOTE — Patient Instructions (Addendum)
 Thank you for coming to the office today.  One a day  or Centrum Silver men's 50+ multivitamin + iron included. If you cannot find one with iron, you can take separate Ferrous Sulfate iron pill 325mg  (Fe 65) plus vitamin C  [facility] -1st floor [address] [place],  [place]  [address] Phone: [phone]  Future Scan Coronary Calcium Score Cardiac CT Scan. This is a screening test for patients aged 74-50+ with cardiovascular risk factors or who are healthy but would be interested in Cardiovascular Screening for heart disease. Even if there is a family history of heart disease, this imaging can be useful. Typically it can be done every 5+ years or at a different timeline we agree on  The scan will look at the chest and mainly focus on the heart and identify early signs of calcium build up or blockages within the heart arteries. It is not 100% accurate for identifying blockages or heart disease, but it is useful to help us  predict who may have some early changes or be at risk in the future for a heart attack or cardiovascular problem.  The results are reviewed by a Cardiologist and they will document the results. It should become available on MyChart. Typically the results are divided into percentiles based on other patients of the same demographic and age. So it will compare your risk to others similar to you. If you have a higher score >99 or higher percentile >75%tile, it is recommended to consider Statin cholesterol therapy and or referral to Cardiologist. I will try to help explain your results and if we have questions we can contact the Cardiologist.  You will be contacted for scheduling. Usually it is done at any imaging facility through [facility], [facility] or [facility].  The cost is $99 flat fee total and it does not go through insurance, so no authorization is required.  -------------  Consider  Home Test Cologuard if interested for screening.  Okay to take Ensure or Boost supplement.   After you see the Urologist - then we can consider Hematologist next.  Call us  to schedule follow up after you see Urologist  Please schedule a Follow-up Appointment to: Return in about 3 months (around 10/27/2023) for 3 month follow-up anemia, weight loss, urology updates.  If you have any other questions or concerns, please feel free to call the office or send a message through MyChart. You may also schedule an earlier appointment if necessary.  Additionally, you may be receiving a survey about your experience at our office within a few days to 1 week by e-mail or mail. We value your feedback.  Domingo Friend, DO [facility], [place]

## 2023-07-27 NOTE — Progress Notes (Signed)
 Subjective:    Patient ID: Kevin [name], male    DOB: Apr 30, 1940, 83 y.o.   MRN: [id number]  Kevin [name] is a 83 y.o. male presenting on 07/27/2023 for Annual Exam   HPI  Discussed the use of AI scribe software for clinical note transcription with the patient, who gave verbal consent to proceed.  History of Present Illness   Kevin [name] is an 83 year old male who presents for an annual physical exam.  He is considering receiving a pneumonia vaccine, as he has not had one before. He received a shingles vaccine at a pharmacy a couple of years ago.  He has a history of elevated PSA levels, with the most recent result being 5.17. Previous levels were 5.76 and 6.3 over the past years. No urinary dysfunction such as a weaker stream or difficulty urinating, but his urinary flow is not very powerful. He was previously referred to a urologist years back in 2021 but did not attend the appointment due to unforeseen circumstances. He admits lost to follow-up.  He reports mild anemia over the past four years, with low iron levels. He has been consuming foods with beans and green beans. He has not tried iron supplements or multivitamins with iron recently, as his one-a-day multivitamin was outdated. No blood in the stool or dark stools.  His weight has fluctuated slightly, currently at 128 pounds, down from 131 pounds in May. He is trying to eat more vegetables and some meats, and is considering high-calorie, high-protein meal replacements like Ensure or Boost to help maintain his weight. - Over past 4+ years he has had some unintentional weight loss  He uses Flonase  nasal spray for nasal congestion, which he associates with his false teeth causing a sensation of shortness of breath. He also uses medication for stomach acid, primarily to manage gas, and prefers a 90-day supply as it is covered by insurance.     GERD / Gas He request refill Omeprazole  40mg  daily, it helped reduce gas and GERD.  Needs refills  Health Maintenance:  Elevated PSA 5.17 prior past 3-4 years has had elevated 5.7 to 6 range in past. He was referred to Urology and no showed apt.     07/01/2023   11:35 AM 01/19/2023    4:38 PM 01/23/2022   11:20 AM  Depression screen PHQ 2/9  Decreased Interest 3 3 0  Down, Depressed, Hopeless 0 0 0  PHQ - 2 Score 3 3 0  Altered sleeping 0 0 0  Tired, decreased energy 0 0 0  Change in appetite 0 0 0  Feeling bad or failure about yourself  0 0 0  Trouble concentrating 0 0 0  Moving slowly or fidgety/restless 0 0 0  Suicidal thoughts 0 0 0  PHQ-9 Score 3 3 0  Difficult doing work/chores Not difficult at all Not difficult at all Not difficult at all       07/01/2023   11:36 AM 01/19/2023    4:39 PM 04/23/2021    1:55 PM 12/27/2018    3:50 PM  GAD 7 : Generalized Anxiety Score  Nervous, Anxious, on Edge 0 0 0 0  Control/stop worrying 0 0 0 0  Worry too much - different things 0 0 0 0  Trouble relaxing 0 0 0 0  Restless 0 0 0 0  Easily annoyed or irritable 0 0 0 0  Afraid - awful might happen 0 0 0 0  Total GAD 7  Score 0 0 0 0  Anxiety Difficulty Not difficult at all  Not difficult at all      Past Medical History:  Diagnosis Date   Arthritis    GERD (gastroesophageal reflux disease)    Past Surgical History:  Procedure Laterality Date   COLONOSCOPY WITH PROPOFOL  N/A 06/05/2015   Procedure: COLONOSCOPY WITH PROPOFOL ;  Surgeon: Jerlean Mood, MD;  Location: [facility];  Service: Endoscopy;  Laterality: N/A;   HERNIA REPAIR Left 03/24/2004   inguinal    HERNIA REPAIR Right 1990   inguinal   XI ROBOTIC ASSISTED INGUINAL HERNIA REPAIR WITH MESH Right 02/20/2020   Procedure: XI ROBOTIC ASSISTED INGUINAL HERNIA REPAIR WITH MESH, recurrent;  Surgeon: Emmalene Hare, MD;  Location: [facility];  Service: General;  Laterality: Right;   Social History   Socioeconomic History   Marital status: Married    Spouse name: Stana Ear   Number of children: 2    Years of education: Not on file   Highest education level: Not on file  Occupational History   Not on file  Tobacco Use   Smoking status: Former   Smokeless tobacco: Never  Vaping Use   Vaping status: Never Used  Substance and Sexual Activity   Alcohol use: Never    Alcohol/week: 0.0 standard drinks of alcohol    Comment: rarely    Drug use: No   Sexual activity: Not on file  Other Topics Concern   Not on file  Social History Narrative   Not on file   Social Drivers of Health   Financial Resource Strain: Low Risk  (01/19/2023)   Overall Financial Resource Strain (CARDIA)    Difficulty of Paying Living Expenses: Not hard at all  Food Insecurity: Unknown (01/19/2023)   Hunger Vital Sign    Worried About Running Out of Food in the Last Year: Never true    Ran Out of Food in the Last Year: Not on file  Transportation Needs: No Transportation Needs (01/19/2023)   PRAPARE - Administrator, Civil Service (Medical): No    Lack of Transportation (Non-Medical): No  Physical Activity: Insufficiently Active (01/19/2023)   Exercise Vital Sign    Days of Exercise per Week: 2 days    Minutes of Exercise per Session: 30 min  Stress: No Stress Concern Present (01/19/2023)   [employer] of Occupational Health - Occupational Stress Questionnaire    Feeling of Stress : Not at all  Social Connections: Moderately Integrated (01/19/2023)   Social Connection and Isolation Panel [NHANES]    Frequency of Communication with Friends and Family: Once a week    Frequency of Social Gatherings with Friends and Family: Once a week    Attends Religious Services: More than 4 times per year    Active Member of [employer] or Organizations: Yes    Attends Engineer, structural: More than 4 times per year    Marital Status: Married  Catering manager Violence: Not At Risk (01/19/2023)   Humiliation, Afraid, Rape, and Kick questionnaire    Fear of Current or Ex-Partner: No    Emotionally  Abused: No    Physically Abused: No    Sexually Abused: No   Family History  Problem Relation Age of Onset   Ovarian cancer Mother    Alcohol abuse Father    Current Outpatient Medications on File Prior to Visit  Medication Sig   ciclopirox  (LOPROX ) 0.77 % cream Apply topically 2 (two) times daily as needed (fungal  infection).   diclofenac  Sodium (VOLTAREN ) 1 % GEL APPLY 2 GRAMS TOPICALLY TO THE AFFECTED AREA FOUR TIMES DAILY   ibuprofen  (ADVIL ) 600 MG tablet TAKE 1 TABLET BY MOUTH EVERY 8 HOURS AS NEEDED   sildenafil  (VIAGRA ) 100 MG tablet Take 1 tablet (100 mg total) by mouth as needed for erectile dysfunction.   triamcinolone  cream (KENALOG ) 0.5 % Apply 1 Application topically 2 (two) times daily. To affected areas, for up to 2 weeks.   No current facility-administered medications on file prior to visit.    Review of Systems  Constitutional:  Positive for unexpected weight change. Negative for activity change, appetite change, chills, diaphoresis, fatigue and fever.  HENT:  Negative for congestion and hearing loss.   Eyes:  Negative for visual disturbance.  Respiratory:  Negative for cough, chest tightness, shortness of breath and wheezing.   Cardiovascular:  Negative for chest pain, palpitations and leg swelling.  Gastrointestinal:  Negative for abdominal pain, constipation, diarrhea, nausea and vomiting.  Genitourinary:  Negative for dysuria, frequency and hematuria.  Musculoskeletal:  Negative for arthralgias and neck pain.  Skin:  Negative for rash.  Neurological:  Negative for dizziness, weakness, light-headedness, numbness and headaches.  Hematological:  Negative for adenopathy.  Psychiatric/Behavioral:  Negative for behavioral problems, dysphoric mood and sleep disturbance.    Per HPI unless specifically indicated above     Objective:     BP 124/78 (BP Location: Left Arm, Patient Position: Sitting, Cuff Size: Normal)   Pulse 77   Ht 5\' 8"  (1.727 m)   Wt 128 lb 8  oz (58.3 kg)   SpO2 97%   BMI 19.54 kg/m   Wt Readings from Last 3 Encounters:  07/27/23 128 lb 8 oz (58.3 kg)  07/01/23 131 lb 2 oz (59.5 kg)  01/19/23 130 lb (59 kg)    Physical Exam Vitals and nursing note reviewed.  Constitutional:      General: He is not in acute distress.    Appearance: He is well-developed. He is not diaphoretic.     Comments: Well-appearing, comfortable, cooperative  HENT:     Head: Normocephalic and atraumatic.  Eyes:     General:        Right eye: No discharge.        Left eye: No discharge.     Conjunctiva/sclera: Conjunctivae normal.     Pupils: Pupils are equal, round, and reactive to light.  Neck:     Thyroid: No thyromegaly.  Cardiovascular:     Rate and Rhythm: Normal rate and regular rhythm.     Pulses: Normal pulses.     Heart sounds: Normal heart sounds. No murmur heard. Pulmonary:     Effort: Pulmonary effort is normal. No respiratory distress.     Breath sounds: Normal breath sounds. No wheezing or rales.  Abdominal:     General: Bowel sounds are normal. There is no distension.     Palpations: Abdomen is soft. There is no mass.     Tenderness: There is no abdominal tenderness.  Musculoskeletal:        General: No tenderness. Normal range of motion.     Cervical back: Normal range of motion and neck supple.     Comments: Upper / Lower Extremities: - Normal muscle tone, strength bilateral upper extremities 5/5, lower extremities 5/5  Lymphadenopathy:     Cervical: No cervical adenopathy.  Skin:    General: Skin is warm and dry.     Findings: No erythema or rash.  Neurological:     Mental Status: He is alert and oriented to person, place, and time.     Comments: Distal sensation intact to light touch all extremities  Psychiatric:        Mood and Affect: Mood normal.        Behavior: Behavior normal.        Thought Content: Thought content normal.     Comments: Well groomed, good eye contact, normal speech and thoughts      Results for orders placed or performed in visit on 07/22/23  Comprehensive metabolic panel with GFR   Collection Time: 07/22/23  7:59 AM  Result Value Ref Range   Glucose, Bld 83 65 - 99 mg/dL   BUN 14 7 - 25 mg/dL   Creat 1.61 0.96 - 0.45 mg/dL   eGFR 82 > OR = 60 WU/JWJ/1.91Y7   BUN/Creatinine Ratio SEE NOTE: 6 - 22 (calc)   Sodium 140 135 - 146 mmol/L   Potassium 4.0 3.5 - 5.3 mmol/L   Chloride 104 98 - 110 mmol/L   CO2 28 20 - 32 mmol/L   Calcium 9.4 8.6 - 10.3 mg/dL   Total Protein 7.2 6.1 - 8.1 g/dL   Albumin 4.0 3.6 - 5.1 g/dL   Globulin 3.2 1.9 - 3.7 g/dL (calc)   AG Ratio 1.3 1.0 - 2.5 (calc)   Total Bilirubin 0.7 0.2 - 1.2 mg/dL   Alkaline phosphatase (APISO) 47 35 - 144 U/L   AST 17 10 - 35 U/L   ALT 11 9 - 46 U/L  PSA   Collection Time: 07/22/23  7:59 AM  Result Value Ref Range   PSA 5.17 (H) < OR = 4.00 ng/mL  CBC with Differential/Platelet   Collection Time: 07/22/23  7:59 AM  Result Value Ref Range   WBC 3.1 (L) 3.8 - 10.8 Thousand/uL   RBC 4.41 4.20 - 5.80 Million/uL   Hemoglobin 11.7 (L) 13.2 - 17.1 g/dL   HCT 82.9 (L) 56.2 - 13.0 %   MCV 87.1 80.0 - 100.0 fL   MCH 26.5 (L) 27.0 - 33.0 pg   MCHC 30.5 (L) 32.0 - 36.0 g/dL   RDW 86.5 78.4 - 69.6 %   Platelets 218 140 - 400 Thousand/uL   MPV 10.9 7.5 - 12.5 fL   Neutro Abs 1,463 (L) 1,500 - 7,800 cells/uL   Absolute Lymphocytes 1,209 850 - 3,900 cells/uL   Absolute Monocytes 360 200 - 950 cells/uL   Eosinophils Absolute 50 15 - 500 cells/uL   Basophils Absolute 19 0 - 200 cells/uL   Neutrophils Relative % 47.2 %   Total Lymphocyte 39.0 %   Monocytes Relative 11.6 %   Eosinophils Relative 1.6 %   Basophils Relative 0.6 %  Hemoglobin A1c   Collection Time: 07/22/23  7:59 AM  Result Value Ref Range   Hgb A1c MFr Bld 5.5 <5.7 %   Mean Plasma Glucose 111 mg/dL   eAG (mmol/L) 6.2 mmol/L  Lipid panel   Collection Time: 07/22/23  7:59 AM  Result Value Ref Range   Cholesterol 173 <200 mg/dL    HDL 56 > OR = 40 mg/dL   Triglycerides 49 <295 mg/dL   LDL Cholesterol (Calc) 103 (H) mg/dL (calc)   Total CHOL/HDL Ratio 3.1 <5.0 (calc)   Non-HDL Cholesterol (Calc) 117 <130 mg/dL (calc)      Assessment & Plan:   Problem List Items Addressed This Visit     Elevated PSA, less than 10 ng/ml  Relevant Orders   Ambulatory referral to Urology   Gastroesophageal reflux disease   Relevant Medications   omeprazole  (PRILOSEC) 40 MG capsule   Primary osteoarthritis involving multiple joints   Other Visit Diagnoses       Annual physical exam    -  Primary     Need for Streptococcus pneumoniae vaccination       Relevant Orders   Pneumococcal conjugate vaccine 20-valent (Completed)     Normocytic anemia         Acute non-recurrent frontal sinusitis       Relevant Medications   fluticasone  (FLONASE ) 50 MCG/ACT nasal spray        Updated Health Maintenance information Reviewed recent lab results with patient Encouraged improvement to lifestyle with diet and exercise Goal of weight loss   Elevated PSA PSA 5.17, elevated but improved. Past history of elevated PSA 4 years ago 6.3 and then 3 yr ago at 5.7. He was lost to follow-up and unsuccessful with Urology apt. He admits that prior referral that was made, he declined the apt and did not return or follow-up. Possible benign prostatic hyperplasia. Referral to urologist for further evaluation necessary. - Refer to urologist for further evaluation of elevated PSA. - Instruct him to follow up with urologist and schedule an appointment.  Weight Loss Weight decreased to 128 lbs, 2 lbs loss in a month. No specific cause identified. High-calorie, high-protein supplements recommended. - Recommend high-calorie, high-protein supplements like Ensure or Boost between meals to help maintain or increase weight.  Mild Anemia Mild anemia with low iron levels for four years. No obvious blood loss. Potential dietary deficiency or GI blood loss.  Colonoscopy not recommended after age 41. - Recommend starting a men's multivitamin with iron or separate ferrous sulfate 325 mg plus vitamin C. - Consider referral to gastroenterologist or hematologist if anemia persists or worsens. - Consider home test Cologuard if interested for screening.  General Health Maintenance Due for pneumonia vaccine. Shingles vaccine administered. Blood pressure controlled. No prediabetes or concerning cholesterol. Declined heart scan. - Administer pneumonia vaccine. - Offer heart scan if he becomes interested.  Follow-up Follow-up necessary post-urologist visit to assess progress and need for further referrals or testing. - Schedule follow-up appointment in 8-12 weeks after urologist visit to assess progress and determine need for further referrals or testing. - Provide him with printed summary of visit and instructions for follow-up.      Orders Placed This Encounter  Procedures   Pneumococcal conjugate vaccine 20-valent   Ambulatory referral to Urology    Referral Priority:   Routine    Referral Type:   Consultation    Referral Reason:   Specialty Services Required    Requested Specialty:   Urology    Number of Visits Requested:   1    Meds ordered this encounter  Medications   fluticasone  (FLONASE ) 50 MCG/ACT nasal spray    Sig: Place 2 sprays into both nostrils daily. Use for 4-6 weeks then stop and use seasonally or as needed.    Dispense:  16 g    Refill:  3   omeprazole  (PRILOSEC) 40 MG capsule    Sig: Take 1 capsule (40 mg total) by mouth daily before breakfast.    Dispense:  90 capsule    Refill:  1     Follow up plan: Return in about 3 months (around 10/27/2023) for 3 month follow-up anemia, weight loss, urology updates.  Domingo Friend, DO [facility] [place]  [facility] 07/27/2023, 1:26 PM

## 2023-07-28 DIAGNOSIS — Z23 Encounter for immunization: Secondary | ICD-10-CM | POA: Diagnosis not present

## 2023-08-03 ENCOUNTER — Encounter: Payer: Self-pay | Admitting: Family Medicine

## 2023-08-03 DIAGNOSIS — E7800 Pure hypercholesterolemia, unspecified: Secondary | ICD-10-CM | POA: Insufficient documentation

## 2023-08-30 ENCOUNTER — Ambulatory Visit: Admitting: Urology

## 2023-09-29 ENCOUNTER — Ambulatory Visit: Admitting: Urology

## 2023-10-26 DIAGNOSIS — Z0279 Encounter for issue of other medical certificate: Secondary | ICD-10-CM

## 2023-12-02 ENCOUNTER — Telehealth: Payer: Self-pay

## 2023-12-02 DIAGNOSIS — R030 Elevated blood-pressure reading, without diagnosis of hypertension: Secondary | ICD-10-CM | POA: Diagnosis not present

## 2023-12-02 DIAGNOSIS — N182 Chronic kidney disease, stage 2 (mild): Secondary | ICD-10-CM | POA: Diagnosis not present

## 2023-12-02 DIAGNOSIS — K08109 Complete loss of teeth, unspecified cause, unspecified class: Secondary | ICD-10-CM | POA: Diagnosis not present

## 2023-12-02 DIAGNOSIS — N529 Male erectile dysfunction, unspecified: Secondary | ICD-10-CM | POA: Diagnosis not present

## 2023-12-02 DIAGNOSIS — L309 Dermatitis, unspecified: Secondary | ICD-10-CM | POA: Diagnosis not present

## 2023-12-02 DIAGNOSIS — M1990 Unspecified osteoarthritis, unspecified site: Secondary | ICD-10-CM | POA: Diagnosis not present

## 2023-12-02 DIAGNOSIS — K219 Gastro-esophageal reflux disease without esophagitis: Secondary | ICD-10-CM | POA: Diagnosis not present

## 2023-12-02 DIAGNOSIS — J302 Other seasonal allergic rhinitis: Secondary | ICD-10-CM | POA: Diagnosis not present

## 2023-12-02 DIAGNOSIS — Z8249 Family history of ischemic heart disease and other diseases of the circulatory system: Secondary | ICD-10-CM | POA: Diagnosis not present

## 2023-12-02 NOTE — Telephone Encounter (Signed)
 Copied from CRM #[id number]. Topic: Clinical - Request for Lab/Test Order >> Dec 02, 2023  2:52 PM Tobias CROME wrote: Reason for CRM: Patient requesting appointment for labs. I do not see future order for labs. Advised unable to schedule without lab orders.   Patient requesting callback: [phone]

## 2023-12-02 NOTE — Telephone Encounter (Signed)
 Left message for patient to return call OK  to schedule an appointment to Dr Edman. He missed his in September.

## 2023-12-06 ENCOUNTER — Ambulatory Visit: Admitting: Family Medicine

## 2023-12-08 ENCOUNTER — Encounter: Payer: Self-pay | Admitting: Family Medicine

## 2023-12-08 ENCOUNTER — Ambulatory Visit (INDEPENDENT_AMBULATORY_CARE_PROVIDER_SITE_OTHER): Admitting: Family Medicine

## 2023-12-08 VITALS — BP 130/72 | HR 70 | Ht 68.0 in | Wt 137.5 lb

## 2023-12-08 DIAGNOSIS — Z23 Encounter for immunization: Secondary | ICD-10-CM | POA: Diagnosis not present

## 2023-12-08 DIAGNOSIS — R972 Elevated prostate specific antigen [PSA]: Secondary | ICD-10-CM | POA: Diagnosis not present

## 2023-12-08 DIAGNOSIS — J0110 Acute frontal sinusitis, unspecified: Secondary | ICD-10-CM | POA: Diagnosis not present

## 2023-12-08 DIAGNOSIS — L309 Dermatitis, unspecified: Secondary | ICD-10-CM

## 2023-12-08 DIAGNOSIS — K439 Ventral hernia without obstruction or gangrene: Secondary | ICD-10-CM

## 2023-12-08 DIAGNOSIS — B356 Tinea cruris: Secondary | ICD-10-CM | POA: Diagnosis not present

## 2023-12-08 LAB — POC COVID19 BINAXNOW: SARS Coronavirus 2 Ag: NEGATIVE

## 2023-12-08 MED ORDER — TRIAMCINOLONE ACETONIDE 0.5 % EX CREA
1.0000 | TOPICAL_CREAM | Freq: Two times a day (BID) | CUTANEOUS | 5 refills | Status: AC
Start: 2023-12-08 — End: ?

## 2023-12-08 MED ORDER — CICLOPIROX OLAMINE 0.77 % EX CREA: TOPICAL_CREAM | Freq: Two times a day (BID) | CUTANEOUS | 3 refills | Status: AC | PRN

## 2023-12-08 MED ORDER — AZITHROMYCIN 250 MG PO TABS: ORAL_TABLET | ORAL | 0 refills | Status: AC

## 2023-12-08 MED ORDER — FLUTICASONE PROPIONATE 50 MCG/ACT NA SUSP: 2.0000 | Freq: Every day | NASAL | 3 refills | Status: AC

## 2023-12-08 MED ORDER — BENZONATATE 100 MG PO CAPS: 100.0000 mg | ORAL_CAPSULE | Freq: Three times a day (TID) | ORAL | 0 refills | Status: DC | PRN

## 2023-12-08 NOTE — Patient Instructions (Addendum)
 Thank you for coming to the office today.  Okay to take Tylenol  as needed for arthritis  Recommend to start taking Tylenol  Extra Strength 500mg  tabs - take 1 to 2 tabs per dose (max 1000mg ) every 6-8 hours for pain, max 24 hour daily dose is 6 tablets or 3000mg . In the future you can repeat the same everyday Tylenol  course for 1-2 weeks at a time.   PSA lab test today  COVID test negative today  Stop Ibuprofen  to avoid side effects BP elevation or headache.  Start Azithromycin Z pak (antibiotic) 2 tabs day 1, then 1 tab x 4 days, complete entire course even if improved  Start nasal steroid Flonase  2 sprays in each nostril daily for 4-6 weeks, may repeat course seasonally or as needed  Start Tessalon  Perls take 1 capsule up to 3 times a day as needed for cough   Please schedule a Follow-up Appointment to: Return if symptoms worsen or fail to improve.  If you have any other questions or concerns, please feel free to call the office or send a message through MyChart. You may also schedule an earlier appointment if necessary.  Additionally, you may be receiving a survey about your experience at our office within a few days to 1 week by e-mail or mail. We value your feedback.  Marsa Officer, DO [facility], [place]

## 2023-12-08 NOTE — Progress Notes (Signed)
 Subjective:    Patient ID: Kevin [name], male    DOB: Oct 25, 1940, 83 y.o.   MRN: [id number]  Kevin [name] is a 83 y.o. male presenting on 12/08/2023 for Medical Management of Chronic Issues   HPI  Discussed the use of AI scribe software for clinical note transcription with the patient, who gave verbal consent to proceed.  History of Present Illness   Kevin [name] is an 83 year old male who presents for follow-up on elevated PSA levels and blood pressure management.  Elevated prostate-specific antigen (psa) - PSA level measured at 5.7 on June 5th, decreased to 5.17 on June 10th. He does have chronic history of PSA elevation going back several years. - Referred to Urologist at that time. Missed two subsequent appointments with [facility] for further evaluation due to travel out of the country. - No new prostate symptoms - Due for lab repeat  Elevated BP without HYPERTENSION history - Elevated blood pressure over the weekend - He missed his apt with us  on 10/20 no show, and he arrived later, he was triaged and advised to go seek care at walk in location or possibly fire dept for a BP check, we had no available apt and scheduled him to see me today 10/22. - Suspects ibuprofen  use for arthritis may have contributed to elevated blood pressure - Uses Tylenol  for arthritis, taking two tablets in the morning and two at night, up to six tablets daily  Chronic sinonasal symptoms - Nasal congestion and thick nasal discharge when blowing nose - No antibiotic use for this condition - Uses nasal spray and requests a refill  Abdominal wall mass - History of hernia repair approximately three years ago inguinal Dr Desiderio - [facility] - Now perceives a possible new hernia above the umbilicus, described as possibly larger but not painful      Health Maintenance: Flu Shot today     07/01/2023   11:35 AM 01/19/2023    4:38 PM 01/23/2022   11:20 AM  Depression screen PHQ 2/9  Decreased  Interest 3 3 0  Down, Depressed, Hopeless 0 0 0  PHQ - 2 Score 3 3 0  Altered sleeping 0 0 0  Tired, decreased energy 0 0 0  Change in appetite 0 0 0  Feeling bad or failure about yourself  0 0 0  Trouble concentrating 0 0 0  Moving slowly or fidgety/restless 0 0 0  Suicidal thoughts 0 0 0  PHQ-9 Score 3 3 0  Difficult doing work/chores Not difficult at all Not difficult at all Not difficult at all       07/01/2023   11:36 AM 01/19/2023    4:39 PM 04/23/2021    1:55 PM 12/27/2018    3:50 PM  GAD 7 : Generalized Anxiety Score  Nervous, Anxious, on Edge 0 0 0 0  Control/stop worrying 0 0 0 0  Worry too much - different things 0 0 0 0  Trouble relaxing 0 0 0 0  Restless 0 0 0 0  Easily annoyed or irritable 0 0 0 0  Afraid - awful might happen 0 0 0 0  Total GAD 7 Score 0 0 0 0  Anxiety Difficulty Not difficult at all  Not difficult at all     Social History   Tobacco Use   Smoking status: Former   Smokeless tobacco: Never  Vaping Use   Vaping status: Never Used  Substance Use Topics   Alcohol use:  Never    Alcohol/week: 0.0 standard drinks of alcohol    Comment: rarely    Drug use: No    Review of Systems Per HPI unless specifically indicated above     Objective:    BP 130/72 (BP Location: Left Arm, Patient Position: Sitting, Cuff Size: Normal)   Pulse 70   Ht 5' 8 (1.727 m)   Wt 137 lb 8 oz (62.4 kg)   SpO2 99%   BMI 20.91 kg/m   Wt Readings from Last 3 Encounters:  12/08/23 137 lb 8 oz (62.4 kg)  07/27/23 128 lb 8 oz (58.3 kg)  07/01/23 131 lb 2 oz (59.5 kg)    Physical Exam Vitals and nursing note reviewed.  Constitutional:      General: He is not in acute distress.    Appearance: He is well-developed. He is not diaphoretic.     Comments: Well-appearing, comfortable, cooperative  HENT:     Head: Normocephalic and atraumatic.  Eyes:     General:        Right eye: No discharge.        Left eye: No discharge.     Conjunctiva/sclera: Conjunctivae  normal.  Neck:     Thyroid: No thyromegaly.  Cardiovascular:     Rate and Rhythm: Normal rate and regular rhythm.     Pulses: Normal pulses.     Heart sounds: Normal heart sounds. No murmur heard. Pulmonary:     Effort: Pulmonary effort is normal. No respiratory distress.     Breath sounds: Normal breath sounds. No wheezing or rales.  Abdominal:     General: Bowel sounds are normal. There is no distension.     Palpations: Abdomen is soft.     Tenderness: There is no abdominal tenderness.     Hernia: A hernia (Superior to umbilicus, there is a ventral vs umbilical hernia defect on exam, non tender, reducibled appears to be superior to umbilicus) is present.  Musculoskeletal:        General: Normal range of motion.     Cervical back: Normal range of motion and neck supple.  Lymphadenopathy:     Cervical: No cervical adenopathy.  Skin:    General: Skin is warm and dry.     Findings: No erythema or rash.  Neurological:     Mental Status: He is alert and oriented to person, place, and time. Mental status is at baseline.  Psychiatric:        Behavior: Behavior normal.     Comments: Well groomed, good eye contact, normal speech and thoughts      Results for orders placed or performed in visit on 12/08/23  POC COVID-19 BinaxNow   Collection Time: 12/08/23 11:34 AM  Result Value Ref Range   SARS Coronavirus 2 Ag Negative Negative      Assessment & Plan:   Problem List Items Addressed This Visit     Elevated PSA, less than 10 ng/ml - Primary   Relevant Orders   PSA   Other Visit Diagnoses       Tinea cruris       Relevant Medications   ciclopirox  (LOPROX ) 0.77 % cream   azithromycin (ZITHROMAX) 250 MG tablet     Eczema, unspecified type       Relevant Medications   triamcinolone  cream (KENALOG ) 0.5 %     Acute non-recurrent frontal sinusitis       Relevant Medications   fluticasone  (FLONASE ) 50 MCG/ACT nasal spray   benzonatate  (  TESSALON ) 100 MG capsule    azithromycin (ZITHROMAX) 250 MG tablet   Other Relevant Orders   POC COVID-19 BinaxNow (Completed)     Flu vaccine need       Relevant Orders   Flu vaccine HIGH DOSE PF(Fluzone Trivalent) (Completed)     Ventral hernia without obstruction or gangrene       Relevant Orders   Ambulatory referral to General Surgery        Ventral vs Umbilical hernia Umbilical hernia slightly larger, asymptomatic, likely umbilical or ventral hernia. Prior history inguinal surgery repair in past - Refer to Dr. Desiderio [facility] for evaluation.  Elevated BP without HYPERTENSION  Blood pressure elevated over weekend, likely due to ibuprofen  and or acute situation at that time.  Normalized BP readings since then. Current reading 130/72, normal today - Advise discontinuation of ibuprofen . - Okay to take Tylenol  for arthritis - He is not on anti-hypertension medications at this time.   Acute sinusitis Nasal congestion and throat irritation present. Sinuses clear, deeper issues possible COVID test negative today. - Prescribe Z-Pak antibiotic. - Prescribe nasal spray. - Prescribe cough pearls.  Osteoarthritis multiple joints Ibuprofen  for arthritis pain may elevate blood pressure. Recommend Tylenol . - Recommend Tylenol  Arthritis Strength, 2 tablets morning, afternoon, night, up to 6 tablets/24 hours.  Elevated prostate-specific antigen (PSA) Previous PSA 5.17, down from 5.7. Blood work planned for reassessment. Missed prostate evaluation appointments as I referred him to [facility] but both no show apt were in August September 2025, he was unavailable and out of town could not be seen or schedule. I advised him that based on this repeat lab we may need to repeat that referral and he will need to make sure he keeps the specialist apt next time - Order blood test to reassess PSA. - Assist in rescheduling prostate evaluation if needed.  Tinea cruris and dermatitis Request for antifungal cream and  topical steroid. - Order antifungal cream. - Order topical steroid.       Orders Placed This Encounter  Procedures   Flu vaccine HIGH DOSE PF(Fluzone Trivalent)   PSA   Ambulatory referral to General Surgery    Referral Priority:   Routine    Referral Type:   Surgical    Referral Reason:   Specialty Services Required    Requested Specialty:   General Surgery    Number of Visits Requested:   1   POC COVID-19 BinaxNow    Meds ordered this encounter  Medications   ciclopirox  (LOPROX ) 0.77 % cream    Sig: Apply topically 2 (two) times daily as needed (fungal infection).    Dispense:  15 g    Refill:  3   triamcinolone  cream (KENALOG ) 0.5 %    Sig: Apply 1 Application topically 2 (two) times daily. To affected areas, for up to 2 weeks.    Dispense:  30 g    Refill:  5   fluticasone  (FLONASE ) 50 MCG/ACT nasal spray    Sig: Place 2 sprays into both nostrils daily. Use for 4-6 weeks then stop and use seasonally or as needed.    Dispense:  16 g    Refill:  3   benzonatate  (TESSALON ) 100 MG capsule    Sig: Take 1 capsule (100 mg total) by mouth 3 (three) times daily as needed for cough.    Dispense:  30 capsule    Refill:  0   azithromycin (ZITHROMAX) 250 MG tablet    Sig: Take 2  tablets on day 1, then 1 tablet daily on days 2 through 5    Dispense:  6 tablet    Refill:  0    Follow up plan: Return if symptoms worsen or fail to improve.   Marsa Officer, DO [facility] 12/08/2023, 11:23 AM

## 2023-12-09 ENCOUNTER — Ambulatory Visit: Admitting: Family Medicine

## 2023-12-09 LAB — PSA: PSA: 5.46 ng/mL — ABNORMAL HIGH (ref <=4.00)

## 2023-12-10 ENCOUNTER — Ambulatory Visit: Payer: Self-pay | Admitting: Family Medicine

## 2023-12-10 DIAGNOSIS — R972 Elevated prostate specific antigen [PSA]: Secondary | ICD-10-CM

## 2023-12-13 ENCOUNTER — Ambulatory Visit: Admitting: Family Medicine

## 2024-01-10 ENCOUNTER — Ambulatory Visit: Admitting: Surgery

## 2024-01-24 ENCOUNTER — Ambulatory Visit: Admitting: Surgery

## 2024-01-24 ENCOUNTER — Telehealth: Payer: Self-pay | Admitting: Surgery

## 2024-01-24 ENCOUNTER — Encounter: Payer: Self-pay | Admitting: Surgery

## 2024-01-24 VITALS — BP 127/67 | HR 68 | Ht 68.0 in | Wt 139.0 lb

## 2024-01-24 DIAGNOSIS — K439 Ventral hernia without obstruction or gangrene: Secondary | ICD-10-CM

## 2024-01-24 NOTE — Progress Notes (Signed)
Request for Medical Clearance has been faxed to Dr Parks Ranger.  ?

## 2024-01-24 NOTE — Progress Notes (Unsigned)
 01/24/2024  History of Present Illness: Kevin [name] is a 83 y.o. male presenting for evaluation of a knot above his umbilicus.  The patient has a history of bilateral open inguinal hernia repairs in 1990 for the right side in 2006 for the left side.  He had a recurrence on the right side and is now status post robotic right inguinal hernia repair on 02/20/2020 with me.  He reports that shortly after surgery he discussed with us  about feeling this knot above the umbilicus.  However we do not have documentation in our notes where this was ever discussed with him.  He reports that the area is intermittently tender but denies any worsening pain issues.  Nonetheless, he would like this to get addressed.  Denies any fevers, chills, chest pain, shortness of breath.  Denies any issues on either groin.  Past Medical History: Past Medical History:  Diagnosis Date   Arthritis    GERD (gastroesophageal reflux disease)      Past Surgical History: Past Surgical History:  Procedure Laterality Date   COLONOSCOPY WITH PROPOFOL  N/A 06/05/2015   Procedure: COLONOSCOPY WITH PROPOFOL ;  Surgeon: Louanne KANDICE Muse, MD;  Location: [facility];  Service: Endoscopy;  Laterality: N/A;   HERNIA REPAIR Left 03/24/2004   inguinal    HERNIA REPAIR Right 1990   inguinal   XI ROBOTIC ASSISTED INGUINAL HERNIA REPAIR WITH MESH Right 02/20/2020   Procedure: XI ROBOTIC ASSISTED INGUINAL HERNIA REPAIR WITH MESH, recurrent;  Surgeon: Desiderio Schanz, MD;  Location: [facility] ORS;  Service: General;  Laterality: Right;    Home Medications: Prior to Admission medications   Medication Sig Start Date End Date Taking? Authorizing Provider  benzonatate  (TESSALON ) 100 MG capsule Take 1 capsule (100 mg total) by mouth 3 (three) times daily as needed for cough. 12/08/23  Yes Karamalegos, [name], DO  ciclopirox  (LOPROX ) 0.77 % cream Apply topically 2 (two) times daily as needed (fungal infection). 12/08/23  Yes Karamalegos,  [name], DO  diclofenac  Sodium (VOLTAREN ) 1 % GEL APPLY 2 GRAMS TOPICALLY TO THE AFFECTED AREA FOUR TIMES DAILY 07/01/23  Yes Karamalegos, [name], DO  fluticasone  (FLONASE ) 50 MCG/ACT nasal spray Place 2 sprays into both nostrils daily. Use for 4-6 weeks then stop and use seasonally or as needed. 12/08/23  Yes Karamalegos, [name], DO  ibuprofen  (ADVIL ) 600 MG tablet TAKE 1 TABLET BY MOUTH EVERY 8 HOURS AS NEEDED 07/01/23  Yes Karamalegos, [name], DO  omeprazole  (PRILOSEC) 40 MG capsule Take 1 capsule (40 mg total) by mouth daily before breakfast. 07/27/23  Yes Karamalegos, [name], DO  sildenafil  (VIAGRA ) 100 MG tablet Take 1 tablet (100 mg total) by mouth as needed for erectile dysfunction. 07/01/23  Yes Karamalegos, [name], DO  triamcinolone  cream (KENALOG ) 0.5 % Apply 1 Application topically 2 (two) times daily. To affected areas, for up to 2 weeks. 12/08/23  Yes Edman [name], DO    Allergies: Allergies  Allergen Reactions   Tomato Itching   Shellfish Allergy Itching and Rash    Review of Systems: Review of Systems  Constitutional:  Negative for chills and fever.  Respiratory:  Negative for shortness of breath.   Cardiovascular:  Negative for chest pain.  Gastrointestinal:  Positive for abdominal pain (intermittent). Negative for nausea and vomiting.    Physical Exam BP 127/67   Pulse 68   Ht 5' 8 (1.727 m)   Wt 139 lb (63 kg)   SpO2 (!) 68%   BMI 21.13  kg/m  CONSTITUTIONAL: No acute distress, well-nourished HEENT:  Normocephalic, atraumatic, extraocular motion intact. RESPIRATORY:  Lungs are clear, and breath sounds are equal bilaterally. Normal respiratory effort without pathologic use of accessory muscles. CARDIOVASCULAR: Heart is regular without murmurs, gallops, or rubs. GI: The abdomen is soft, nondistended, with mild discomfort to palpation above the umbilicus.  The patient has prior incisions of both groin areas are well-healed  without any evidence of hernia recurrence and also laparoscopic incisions from his last robotic inguinal hernia repair.  All these are well-healed.  Did not that the patient is describing is approximately 2 cm above the umbilicus.  This shows a reducible hernia (though with some effort) which appears to be very small in size and appears to also contain preperitoneal fat.  No evidence of incarceration or strangulation.  NEUROLOGIC:  Motor and sensation is grossly normal.  Cranial nerves are grossly intact. PSYCH:  Alert and oriented to person, place and time. Affect is normal.  Labs/Imaging: Labs from 07/22/2023: Sodium 140, potassium 4, chloride 104, CO2 28, BUN 14, creatinine 0.93.  LFTs within normal limits.  WBC 3.1, hemoglobin 11.7, hematocrit 38.4, platelets 218.  Assessment and Plan: This is a 83 y.o. male with new epigastric ventral hernia.  - The patient reports that she discussed about this area in the epigastric region before with us .  Unfortunately we do not have documentation of this.  Nonetheless given this finding and the symptoms that he is having from it, I think it is prudent to repair this hernia.  Discussed with patient also that the hernia site is about 2 cm above the incision that I made for the umbilical port.  I cannot confidently say that this would be an incisional hernia but could also be a new ventral hernia. - Discussed with the patient the plan for an open ventral hernia repair and reviewed the surgery at length with him including the planned incision, risks of bleeding, infection, injury to surrounding structures, the potential use of mesh depending on the final true size of the hernia, that this will be an outpatient procedure, postoperative activity restrictions, pain control, and he is willing to proceed. - Based on his schedule, we will schedule him for surgery on 02/24/2024.  All of his questions have been answered.  Will send for medical clearance to his PCP.  I spent  30 minutes dedicated to the care of this patient on the date of this encounter to include pre-visit review of records, face-to-face time with the patient discussing diagnosis and management, and any post-visit coordination of care.   Aloysius Sheree Plant, MD [facility]

## 2024-01-24 NOTE — Telephone Encounter (Signed)
 Patient has been advised of Pre-Admission date/time, and Surgery date at [facility].  Surgery Date: 02/24/24 Preadmission Testing Date: Preadmissions to call patient.   Patient informed of the scheduling process and surgery information given at time of office visit.   Patient has been made aware to call [phone], between 1-3:00pm the day before surgery, to find out what time to arrive for surgery.

## 2024-01-24 NOTE — Patient Instructions (Signed)
 You have requested to have a Ventral Hernia Repair. This will be done by Dr Aleen Campi at [facility]. Please see your (BLUE) Pre-care sheet for more information. Our surgery scheduler will call you to look at surgery dates and to go over surgery information.   You will need to arrange to be out of work for approximately 1-2 weeks and then you may return with a lifting restriction for 4 more weeks. If you have FMLA or Disability paperwork that needs to be filled out, please have your company fax your paperwork to [phone] or you may drop this by either office. This paperwork will be filled out within 3 days after your surgery has been completed.     Ventral Hernia A ventral hernia (also called an incisional hernia) is a hernia that occurs at the site of a previous surgical cut (incision) in the abdomen. The abdominal wall spans from your lower chest down to your pelvis. If the abdominal wall is weakened from a surgical incision, a hernia can occur. A hernia is a bulge of bowel or muscle tissue pushing out on the weakened part of the abdominal wall. Ventral hernias can get bigger from straining or lifting. Obese and older people are at higher risk for a ventral hernia. People who develop infections after surgery or require repeat incisions at the same site on the abdomen are also at increased risk. CAUSES  A ventral hernia occurs because of weakness in the abdominal wall at an incision site.  SYMPTOMS  Common symptoms include: A visible bulge or lump on the abdominal wall. Pain or tenderness around the lump. Increased discomfort if you cough or make a sudden movement. If the hernia has blocked part of the intestine, a serious complication can occur (incarcerated or strangulated hernia). This can become a problem that requires emergency surgery because the blood flow to the blocked intestine may be cut off. Symptoms may include: Feeling sick to your stomach (nauseous). Throwing up (vomiting). Stomach  swelling (distention) or bloating. Fever. Rapid heartbeat. DIAGNOSIS  Your health care provider will take a medical history and perform a physical exam. Various tests may be ordered, such as: Blood tests. Urine tests. Ultrasonography. X-rays. Computed tomography (CT). TREATMENT  Watchful waiting may be all that is needed for a smaller hernia that does not cause symptoms. Your health care provider may recommend the use of a supportive belt (truss) that helps to keep the abdominal wall intact. For larger hernias or those that cause pain, surgery to repair the hernia is usually recommended. If a hernia becomes strangulated, emergency surgery needs to be done right away. HOME CARE INSTRUCTIONS Avoid putting pressure or strain on the abdominal area. Avoid heavy lifting. Use good body positioning for physical tasks. Ask your health care provider about proper body positioning. Use a supportive belt as directed by your health care provider. Maintain a healthy weight. Eat foods that are high in fiber, such as whole grains, fruits, and vegetables. Fiber helps prevent difficult bowel movements (constipation). Drink enough fluids to keep your urine clear or pale yellow. Follow up with your health care provider as directed. SEEK MEDICAL CARE IF:  Your hernia seems to be getting larger or more painful. SEEK IMMEDIATE MEDICAL CARE IF:  You have abdominal pain that is sudden and sharp. Your pain becomes severe. You have repeated vomiting. You are sweating a lot. You notice a rapid heartbeat. You develop a fever. MAKE SURE YOU:  Understand these instructions. Will watch your condition. Will get help  right away if you are not doing well or get worse.     Open Ventral Hernia Repair Open ventral hernia repair is a surgery to fix a ventral hernia. A ventral hernia,  is a bulge of body tissue or intestines that pushes through the front part of the abdomen. This can happen if the connective tissue  covering the muscles over the abdomen has a weak spot or is torn because of a surgical cut (incision) from a previous surgery. A ventral hernia repair is often done soon after diagnosis to stop the hernia from getting bigger, becoming uncomfortable, or becoming an emergency. This surgery usually takes about 2 hours, but the time can vary greatly.  LET [facility] CARE PROVIDER KNOW ABOUT: Any allergies you have. All medicines you are taking, including steroids, vitamins, herbs, eye drops, creams, and over-the-counter medicines. Previous problems you or members of your family have had with the use of anesthetics. Any blood disorders you have. Previous surgeries you have had. Medical conditions you have.  RISKS AND COMPLICATIONS  Generally, Open ventral hernia repair is a safe procedure. However, as with any surgical procedure, problems can occur. Possible problems include: Bleeding. Trouble passing urine or having a bowel movement after the surgery. Infection. Pneumonia. Blood clots. Pain in the area of the hernia. A bulge in the area of the hernia that may be caused by a collection of fluid. Injury to intestines or other structures in the abdomen. Return of the hernia after surgery.  BEFORE THE PROCEDURE  You may need to have blood tests, urine tests, a chest X-ray, or an electrocardiogram done before the day of the surgery. Ask your health care provider about changing or stopping your regular medicines. This is especially important if you are taking diabetes medicines or blood thinners. You may need to wash with a special type of germ-killing soap. Do not eat or drink anything after midnight the night before the procedure or as directed by your health care provider. Make plans to have someone drive you home after the procedure.  PROCEDURE  Small monitors will be put on your body. They are used to check your heart, blood pressure, and oxygen level. An IV access tube will be put into a  vein in your hand or arm. Fluids and medicine will flow directly into your body through the IV tube. You will be given medicine that makes you go to sleep (general anesthetic). Your abdomen will be cleaned with a special soap to kill any germs on your skin. Once you are asleep, a moderate - large size incision will be made in your abdomen. The size of incision depends on how large your hernia is. Your surgeon puts the tissue or intestines that formed the hernia back in place. A screen-like patch (mesh) is used to close the hernia. This helps make the area stronger. Stitches, tacks, or staples are used to keep the mesh in place. Medicine and a bandage (dressing) or skin glue will be put over the incision.  AFTER THE PROCEDURE  You will stay in a recovery area until the anesthetic wears off. Your blood pressure and pulse will be checked often. You may be able to go home the same day or may need to stay in the hospital for 1-2 days after surgery. Your surgeon will decide when you can go home depending upon your recovery. You may feel some pain. You will be given medicine for pain. You will be urged to do breathing exercises that involve  taking deep breaths. This helps prevent a lung infection after a surgery. You may have to wear compression stockings while you are in the hospital. These stockings help keep blood clots from forming in your legs.   This information is not intended to replace advice given to you by your health care provider. Make sure you discuss any questions you have with your health care provider.   Document Released: 01/20/2012 Document Revised: 02/07/2013 Document Reviewed: 01/20/2012 Elsevier Interactive Patient Education [employer]

## 2024-01-25 ENCOUNTER — Telehealth: Payer: Self-pay

## 2024-01-25 NOTE — Telephone Encounter (Signed)
 Left message for patient to return call OK to schedule an appointment. Patient needs a pre op appointment with Dr Edman prior to 02/24/24

## 2024-02-02 ENCOUNTER — Ambulatory Visit: Admitting: Family Medicine

## 2024-02-04 ENCOUNTER — Inpatient Hospital Stay: Admission: RE | Admit: 2024-02-04 | Source: Ambulatory Visit

## 2024-02-07 ENCOUNTER — Encounter: Payer: Self-pay | Admitting: Family Medicine

## 2024-02-07 ENCOUNTER — Ambulatory Visit: Admitting: Family Medicine

## 2024-02-07 VITALS — BP 130/70 | HR 72 | Ht 68.0 in | Wt 142.1 lb

## 2024-02-07 DIAGNOSIS — S91209A Unspecified open wound of unspecified toe(s) with damage to nail, initial encounter: Secondary | ICD-10-CM | POA: Diagnosis not present

## 2024-02-07 DIAGNOSIS — R972 Elevated prostate specific antigen [PSA]: Secondary | ICD-10-CM | POA: Diagnosis not present

## 2024-02-07 DIAGNOSIS — M7661 Achilles tendinitis, right leg: Secondary | ICD-10-CM | POA: Diagnosis not present

## 2024-02-07 DIAGNOSIS — H6123 Impacted cerumen, bilateral: Secondary | ICD-10-CM | POA: Diagnosis not present

## 2024-02-07 NOTE — Patient Instructions (Addendum)
 Thank you for coming to the office today.  You have thick impacted ear wax (cerumen) blocking ear canals and ear drums.   Unfortunately the wax is not able to clear with just the ear flushing.  I do recommend using the Debrox Ear Drop Flushing Kit OTC  Start OTC Debrox (Carbamide peroxide), use on both sides following instructions on bottle, pharmacist will direct you to the appropriate ear drops if you need help. May take several days to a week or more.  Use the drops and lay on your side while th ear drops soaks into the ear wax to dislodge and break it up and soften it up.  You can use the Bulb Syrine to flush the wax out at home in the shower or bathroom and if it comes out then you are good to go.  If you still have the wax and need us  to try again we can schedule another ear cleaning apt.  Otherwise, we can refer back to [facility] if you prefer and they can help.  [facility] [address]  [place], [place] [address] Ph: [phone]    Please schedule a Follow-up Appointment to: Return if symptoms worsen or fail to improve.  If you have any other questions or concerns, please feel free to call the office or send a message through MyChart. You may also schedule an earlier appointment if necessary.  Additionally, you may be receiving a survey about your experience at our office within a few days to 1 week by e-mail or mail. We value your feedback.  Marsa Officer, DO [facility], [place]

## 2024-02-07 NOTE — Progress Notes (Signed)
 "  Subjective:    Patient ID: Kevin [name], male    DOB: 28-Sep-1940, 83 y.o.   MRN: [id number]  Kevin [name] is a 83 y.o. male presenting on 02/07/2024 for Medical Management of Chronic Issues   HPI  Discussed the use of AI scribe software for clinical note transcription with the patient, who gave verbal consent to proceed.  History of Present Illness   Kevin [name] is an 83 year old male who presents with a broken toenail on the right big toe and heel pain.  Right great toenail injury - Broken toenail on the right great toe, cracked and partially broke off approximately two weeks ago - New nail growing underneath the broken portion - Concern regarding whether the remaining part of the nail needs removal  Right heel and ankle pain - Pain at the back of the right ankle, associated with standing and walking on concrete floors at work and tile floors at home - Wearing a knee brace sometimes exacerbates the pain, possibly due to pinching nerves or muscles - Pain described as a strain when lifting the foot, particularly affecting the Achilles tendon area - Uses a muscle rub for relief, purchased from a discount store, containing the same ingredients as a more expensive version  Cerumen impaction Right ear pressure and hearing changes - Sensation of blockage or pressure in the right ear - Occasional echoing in hearing - History of similar issues evaluated by ENT - Previously advised to use eardrops to help dislodge earwax     Elevated PSA Last lab showed PSA 5.4 we attempted to refer him to Urologist. Awaiting further scheduling. History BPH      07/01/2023   11:35 AM 01/19/2023    4:38 PM 01/23/2022   11:20 AM  Depression screen PHQ 2/9  Decreased Interest 3 3 0  Down, Depressed, Hopeless 0 0 0  PHQ - 2 Score 3 3 0  Altered sleeping 0 0 0  Tired, decreased energy 0 0 0  Change in appetite 0 0 0  Feeling bad or failure about yourself  0 0 0  Trouble concentrating 0 0 0   Moving slowly or fidgety/restless 0 0 0  Suicidal thoughts 0 0 0  PHQ-9 Score 3  3  0   Difficult doing work/chores Not difficult at all Not difficult at all Not difficult at all     Data saved with a previous flowsheet row definition       07/01/2023   11:36 AM 01/19/2023    4:39 PM 04/23/2021    1:55 PM 12/27/2018    3:50 PM  GAD 7 : Generalized Anxiety Score  Nervous, Anxious, on Edge 0 0 0 0  Control/stop worrying 0 0 0 0  Worry too much - different things 0 0 0 0  Trouble relaxing 0 0 0 0  Restless 0 0 0 0  Easily annoyed or irritable 0 0 0 0  Afraid - awful might happen 0 0 0 0  Total GAD 7 Score 0 0 0 0  Anxiety Difficulty Not difficult at all  Not difficult at all     Social History[1]  Review of Systems Per HPI unless specifically indicated above     Objective:    BP 130/70 (BP Location: Left Arm, Patient Position: Sitting, Cuff Size: Normal)   Pulse 72   Ht 5' 8 (1.727 m)   Wt 142 lb 2 oz (64.5 kg)   SpO2 98%   BMI  21.61 kg/m   Wt Readings from Last 3 Encounters:  02/07/24 142 lb 2 oz (64.5 kg)  01/24/24 139 lb (63 kg)  12/08/23 137 lb 8 oz (62.4 kg)    Physical Exam Vitals and nursing note reviewed.  Constitutional:      General: He is not in acute distress.    Appearance: Normal appearance. He is well-developed. He is not diaphoretic.     Comments: Well-appearing, comfortable, cooperative  HENT:     Head: Normocephalic and atraumatic.  Eyes:     General:        Right eye: No discharge.        Left eye: No discharge.     Conjunctiva/sclera: Conjunctivae normal.  Cardiovascular:     Rate and Rhythm: Normal rate.  Pulmonary:     Effort: Pulmonary effort is normal.  Musculoskeletal:     Comments: R Great toenail with deformity, half missing. Rest of nail is intact.  Right lower extremity with tenderness over achilles tendon, no deformity on exam, pain with calf ankle flexion  Skin:    General: Skin is warm and dry.     Findings: No erythema  or rash.  Neurological:     Mental Status: He is alert and oriented to person, place, and time.  Psychiatric:        Mood and Affect: Mood normal.        Behavior: Behavior normal.        Thought Content: Thought content normal.     Comments: Well groomed, good eye contact, normal speech and thoughts     ________________________________________________________ PROCEDURE NOTE Date: 02/07/24 R Ear Lavage / Cerumen Removal Discussed benefits and risks (including pain / discomforts, dizziness, minor abrasion of ear canal). Verbal consent given by patient. Medication:  carbamide peroxide ear drops, Ear Lavage Solution (warm water + hydrogen peroxide) Performed by Dr Edman and Alan Fontana CMA Several drops of carbamide peroxide placed in RIGHT ear, allowed to sit for few minutes. Ear lavage solution flushed into one ear at a time in attempt to dislodge and remove ear wax. Results were unsuccessful unable to remove cerumen.   Results for orders placed or performed in visit on 12/08/23  POC COVID-19 BinaxNow   Collection Time: 12/08/23 11:34 AM  Result Value Ref Range   SARS Coronavirus 2 Ag Negative Negative  PSA   Collection Time: 12/08/23 11:40 AM  Result Value Ref Range   PSA 5.46 (H) < OR = 4.00 ng/mL      Assessment & Plan:   Problem List Items Addressed This Visit     Elevated PSA, less than 10 ng/ml   Relevant Orders   Ambulatory referral to Urology   Other Visit Diagnoses       Tendonitis, Achilles, right    -  Primary     Avulsion of toenail of right foot         Bilateral impacted cerumen       Relevant Orders   Ambulatory referral to ENT        Avulsion of right great toenail Partial avulsion with new nail growth underneath. Remaining nail intact, no infection or inflammation. Brittle nail likely caused break. - Allow new nail to push out old nail naturally. - Avoid soaking toe to prevent infection. - Refer to podiatrist if nail becomes loose or  lifts.  Right Achilles tendinitis Pain in right Achilles tendon due to repetitive strain. Exacerbated by lifting foot, suggesting tendinitis. Knee brace use may  contribute to discomfort. - Continue muscle rub as needed for pain relief. - Limit knee brace use to prevent further strain. - Monitor symptoms and adjust activity levels.  Impacted cerumen, right ear Impacted cerumen causing blockage sensation. Attempts to remove unsuccessful Ear Lavage - Use over-the-counter eardrops to soften cerumen, apply once or twice daily for a few days to a week. - Patient prefers ENT referral. Placed today     Elevated PSA PSA 5.4, up from 5.1, but in past has been higher range History BPH LUTS He was referred to Urology 11/2023 but it was never scheduled Refer again to Urology [facility] for PSA  Orders Placed This Encounter  Procedures   Ambulatory referral to ENT    Referral Priority:   Routine    Referral Type:   Consultation    Referral Reason:   Specialty Services Required    Requested Specialty:   Otolaryngology    Number of Visits Requested:   1   Ambulatory referral to Urology    Referral Priority:   Routine    Referral Type:   Consultation    Referral Reason:   Specialty Services Required    Requested Specialty:   Urology    Number of Visits Requested:   1    No orders of the defined types were placed in this encounter.   Follow up plan: Return if symptoms worsen or fail to improve.    Marsa Officer, DO [facility] 02/07/2024, 11:42 AM     [1]  Social History Tobacco Use   Smoking status: Former    Passive exposure: Past   Smokeless tobacco: Never  Vaping Use   Vaping status: Never Used  Substance Use Topics   Alcohol use: Never    Alcohol/week: 0.0 standard drinks of alcohol    Comment: rarely    Drug use: No   "

## 2024-02-14 ENCOUNTER — Ambulatory Visit: Admitting: Family Medicine

## 2024-02-15 ENCOUNTER — Other Ambulatory Visit: Payer: Self-pay

## 2024-02-15 ENCOUNTER — Encounter
Admission: RE | Admit: 2024-02-15 | Discharge: 2024-02-15 | Disposition: A | Discharge status: Home / Self Care | Source: Ambulatory Visit | Attending: Surgery | Admitting: Surgery

## 2024-02-15 VITALS — Ht 68.0 in | Wt 146.0 lb

## 2024-02-15 DIAGNOSIS — E7800 Pure hypercholesterolemia, unspecified: Secondary | ICD-10-CM

## 2024-02-15 NOTE — Patient Instructions (Addendum)
 Your procedure is scheduled on: Thursday 02/24/24 To find out your arrival time, please call [phone] between 1PM - 3PM on: Wednesday 02/23/24 Report to the Registration Desk on the 1st floor of the Medical Mall. If your arrival time is 6:00 am, do not arrive before that time as the Medical Mall entrance doors do not open until 6:00 am.  REMEMBER: Instructions that are not followed completely may result in serious medical risk, up to and including death; or upon the discretion of your surgeon and anesthesiologist your surgery may need to be rescheduled.  Do not eat food after midnight the night before surgery.  No gum chewing or hard candies.  You may however, drink CLEAR liquids up to 2 hours before you are scheduled to arrive for your surgery. Do not drink anything within 2 hours of your scheduled arrival time.  Clear liquids include: - water   One week prior to surgery: Stop Anti-inflammatories (NSAIDS) such as Advil , Aleve , Ibuprofen , Motrin , Naproxen , Naprosyn  and Aspirin based products such as Excedrin, Goody's Powder, BC Powder.  You may however, continue to take Tylenol  if needed for pain up until the day of surgery.  Stop ANY OVER THE COUNTER supplements and vitamins for at least 7 days until after surgery.  Continue taking all of your other prescription medications up until the day of surgery.  ON THE DAY OF SURGERY ONLY TAKE THESE MEDICATIONS WITH SIPS OF WATER:  none  No Alcohol for 24 hours before or after surgery.  No Smoking including e-cigarettes for 24 hours before surgery.  No chewable tobacco products for at least 6 hours before surgery.  No nicotine patches on the day of surgery.  Do not use any recreational drugs for at least a week (preferably 2 weeks) before your surgery.  Please be advised that the combination of cocaine and anesthesia may have negative outcomes, up to and including death. If you test positive for cocaine, your surgery will be  cancelled.  On the morning of surgery brush your teeth with toothpaste and water, you may rinse your mouth with mouthwash if you wish. Do not swallow any toothpaste or mouthwash.  Use CHG Soap or wipes as directed on instruction sheet. (You can pick this up at our office in the [facility], the building to the left of the [employer], [address] at [address]  Do not shave body hair from the neck down 48 hours before surgery.  Do not wear lotions, powders, or perfumes.   Wear comfortable clothing (specific to your surgery type) to the hospital.  Do not wear jewelry, make-up, hairpins, clips or nail polish.  For welded (permanent) jewelry: bracelets, anklets, waist bands, etc.  Please have this removed prior to surgery.  If it is not removed, there is a chance that hospital personnel will need to cut it off on the day of surgery.  Contact lenses, hearing aids and dentures may not be worn into surgery.  Do not bring valuables to the hospital. [facility] is not responsible for any missing/lost belongings or valuables.   Notify your doctor if there is any change in your medical condition (cold, fever, infection).  After surgery, you can help prevent lung complications by doing breathing exercises.  Take deep breaths and cough every 1-2 hours. Your doctor may order a device called an Incentive Spirometer to help you take deep breaths.  When coughing or sneezing, hold a pillow firmly against your incision with both  hands. This is called splinting. Doing this helps protect your incision. It also decreases belly discomfort.  If you are being discharged the day of surgery, you will not be allowed to drive home. You will need a responsible individual to drive you home and stay with you for 24 hours after surgery.   Please call the Pre-admissions Testing Dept. at [phone] if you have any questions about these instructions.  Surgery Visitation  Policy:  Patients having surgery or a procedure may have two visitors.  Children under the age of 64 must have an adult with them who is not the patient.  Merchandiser, Retail to address health-related social needs:  [email or web address].no                                                                                                             Preparing for Surgery with CHLORHEXIDINE  GLUCONATE (CHG) Soap  Chlorhexidine  Gluconate (CHG) Soap  o An antiseptic cleaner that kills germs and bonds with the skin to continue killing germs even after washing  o Used for showering the night before surgery and morning of surgery  Before surgery, you can play an important role by reducing the number of germs on your skin.  CHG (Chlorhexidine  gluconate) soap is an antiseptic cleanser which kills germs and bonds with the skin to continue killing germs even after washing.  Please do not use if you have an allergy to CHG or antibacterial soaps. If your skin becomes reddened/irritated stop using the CHG.  1. Shower the NIGHT BEFORE SURGERY with CHG soap.  2. If you choose to wash your hair, wash your hair first as usual with your normal shampoo.  3. After shampooing, rinse your hair and body thoroughly to remove the shampoo.  4. Use CHG as you would any other liquid soap. You can apply CHG directly to the skin and wash gently with a clean washcloth.  5. Apply the CHG soap to your body only from the neck down. Do not use on open wounds or open sores. Avoid contact with your eyes, ears, mouth, and genitals (private parts). Wash face and genitals (private parts) with your normal soap.  6. Wash thoroughly, paying special attention to the area where your surgery will be performed.  7. Thoroughly rinse your body with warm water.  8. Do not shower/wash with your normal soap after using and rinsing off the CHG soap.  9. Do not use lotions, oils, etc., after showering with CHG.  10.  Pat yourself dry with a clean towel.  11. Wear clean pajamas to bed the night before surgery.  12. Place clean sheets on your bed the night of your shower and do not sleep with pets.  13. Do not apply any deodorants/lotions/powders.  14. Please wear clean clothes to the hospital.  15. Remember to brush your teeth with your regular toothpaste.

## 2024-02-16 ENCOUNTER — Encounter
Admission: RE | Admit: 2024-02-16 | Discharge: 2024-02-16 | Disposition: A | Discharge status: Home / Self Care | Source: Ambulatory Visit | Attending: Surgery | Admitting: Surgery

## 2024-02-16 DIAGNOSIS — E7800 Pure hypercholesterolemia, unspecified: Secondary | ICD-10-CM | POA: Insufficient documentation

## 2024-02-16 DIAGNOSIS — Z01818 Encounter for other preprocedural examination: Secondary | ICD-10-CM | POA: Insufficient documentation

## 2024-02-16 DIAGNOSIS — Z01810 Encounter for preprocedural cardiovascular examination: Secondary | ICD-10-CM | POA: Diagnosis not present

## 2024-02-16 LAB — CBC
HCT: 37.5 % — ABNORMAL LOW (ref 39.0–52.0)
Hemoglobin: 11.7 g/dL — ABNORMAL LOW (ref 13.0–17.0)
MCH: 26.9 pg (ref 26.0–34.0)
MCHC: 31.2 g/dL (ref 30.0–36.0)
MCV: 86.2 fL (ref 80.0–100.0)
Platelets: 240 K/uL (ref 150–400)
RBC: 4.35 MIL/uL (ref 4.22–5.81)
RDW: 13.8 % (ref 11.5–15.5)
WBC: 4 K/uL (ref 4.0–10.5)
nRBC: 0 % (ref 0.0–0.2)

## 2024-02-16 NOTE — Progress Notes (Signed)
 SABRA

## 2024-02-21 ENCOUNTER — Telehealth: Payer: Self-pay

## 2024-02-21 NOTE — Telephone Encounter (Signed)
 Patient is scheduled on 1/6 for his pre op clarence. He no showed his previous appointment.

## 2024-02-21 NOTE — Telephone Encounter (Signed)
 Copied from CRM [id number]. Topic: General - Other >> Feb 21, 2024  2:56 PM Victoria B wrote: Reason for CRM: carolyn from [facility], states hasn't received clearance for surgery for Jan 8. She states faxed over 3 different days and hasn't gotten anythnig back, the last time faxed was Jan 2

## 2024-02-22 ENCOUNTER — Ambulatory Visit: Admitting: Family Medicine

## 2024-02-23 ENCOUNTER — Encounter: Payer: Self-pay | Admitting: Certified Registered"

## 2024-02-23 NOTE — Progress Notes (Signed)
Medical Clearance has been received from Dr Parks Ranger. The patient is cleared at Low risk for surgery.

## 2024-02-23 NOTE — Progress Notes (Signed)
 Medical Clearance on chart from Dr Lia (02-23-24) Low Risk

## 2024-02-24 ENCOUNTER — Ambulatory Visit: Admission: RE | Admit: 2024-02-24 | Source: Home / Self Care | Admitting: Surgery

## 2024-02-24 ENCOUNTER — Encounter: Admission: RE | Payer: Self-pay | Source: Home / Self Care

## 2024-02-24 SURGERY — REPAIR, HERNIA, VENTRAL
Anesthesia: General

## 2024-02-28 ENCOUNTER — Encounter: Payer: Self-pay | Admitting: Family Medicine

## 2024-03-01 ENCOUNTER — Ambulatory Visit (INDEPENDENT_AMBULATORY_CARE_PROVIDER_SITE_OTHER): Admitting: Family Medicine

## 2024-03-01 ENCOUNTER — Encounter: Payer: Self-pay | Admitting: Family Medicine

## 2024-03-01 VITALS — BP 120/70 | HR 70 | Ht 68.0 in | Wt 141.4 lb

## 2024-03-01 DIAGNOSIS — K439 Ventral hernia without obstruction or gangrene: Secondary | ICD-10-CM | POA: Diagnosis not present

## 2024-03-01 NOTE — Patient Instructions (Addendum)
 Thank you for coming to the office today.  If you find out who / which surgeon and if you need a referral.  [facility] [address] [place], [place] [address] Phone [phone]    Please schedule a Follow-up Appointment to: Return if symptoms worsen or fail to improve.  If you have any other questions or concerns, please feel free to call the office or send a message through MyChart. You may also schedule an earlier appointment if necessary.  Additionally, you may be receiving a survey about your experience at our office within a few days to 1 week by e-mail or mail. We value your feedback.  Marsa Officer, DO [facility], [place]

## 2024-03-01 NOTE — Progress Notes (Signed)
 "  Subjective:    Patient ID: Kevin [name], male    DOB: 18-Jul-1940, 84 y.o.   MRN: [id number]  Kevin [name] is a 84 y.o. male presenting on 03/01/2024 for Medical Management of Chronic Issues (Referral needs to surgery )   HPI  Discussed the use of AI scribe software for clinical note transcription with the patient, who gave verbal consent to proceed.  History of Present Illness   Kevin [name] is an 84 year old male who presents with concerns regarding a ventral hernia and elevated PSA levels.  Ventral hernia Prior hernia surgery in past see record He was scheduled recently for ventral hernia repair Dr Desiderio but patient had URI and did not follow with surgery. He did miss our pre-op as well. - Hernia located above the umbilicus - History of prior hernia repair on the left side; unable to recall surgeon's name - Prefers to have future surgical intervention with the same surgeon, but unsure of surgeon's current practice location or if still practicing - No recent lifting of weights or activities that might strain the hernia area  Elevated prostate-specific antigen (psa) levels - PSA previously measured at 5.7, decreased to 5.3 on most recent blood work - Previous PSA test approximately two and a half months ago showed a result of 5.4 - Concerned about the possibility of prostate cancer - Desires ongoing monitoring of PSA levels through blood work       Past Surgical History:  Procedure Laterality Date   COLONOSCOPY WITH PROPOFOL  N/A 06/05/2015   Procedure: COLONOSCOPY WITH PROPOFOL ;  Surgeon: Louanne KANDICE Muse, MD;  Location: [facility];  Service: Endoscopy;  Laterality: N/A;   HERNIA REPAIR Left 03/24/2004   inguinal    HERNIA REPAIR Right 1990   inguinal   XI ROBOTIC ASSISTED INGUINAL HERNIA REPAIR WITH MESH Right 02/20/2020   Procedure: XI ROBOTIC ASSISTED INGUINAL HERNIA REPAIR WITH MESH, recurrent;  Surgeon: Desiderio Schanz, MD;  Location: [facility];  Service:  General;  Laterality: Right;         07/01/2023   11:35 AM 01/19/2023    4:38 PM 01/23/2022   11:20 AM  Depression screen PHQ 2/9  Decreased Interest 3 3 0  Down, Depressed, Hopeless 0 0 0  PHQ - 2 Score 3 3 0  Altered sleeping 0 0 0  Tired, decreased energy 0 0 0  Change in appetite 0 0 0  Feeling bad or failure about yourself  0 0 0  Trouble concentrating 0 0 0  Moving slowly or fidgety/restless 0 0 0  Suicidal thoughts 0 0 0  PHQ-9 Score 3  3  0   Difficult doing work/chores Not difficult at all Not difficult at all Not difficult at all     Data saved with a previous flowsheet row definition       07/01/2023   11:36 AM 01/19/2023    4:39 PM 04/23/2021    1:55 PM 12/27/2018    3:50 PM  GAD 7 : Generalized Anxiety Score  Nervous, Anxious, on Edge 0 0 0 0  Control/stop worrying 0 0 0 0  Worry too much - different things 0 0 0 0  Trouble relaxing 0 0 0 0  Restless 0 0 0 0  Easily annoyed or irritable 0 0 0 0  Afraid - awful might happen 0 0 0 0  Total GAD 7 Score 0 0 0 0  Anxiety Difficulty Not difficult at all  Not difficult at  all     Social History[1]  Review of Systems Per HPI unless specifically indicated above     Objective:    BP 120/70 (BP Location: Right Arm, Patient Position: Sitting, Cuff Size: Normal)   Pulse 70   Ht 5' 8 (1.727 m)   Wt 141 lb 6 oz (64.1 kg)   SpO2 97%   BMI 21.50 kg/m   Wt Readings from Last 3 Encounters:  03/01/24 141 lb 6 oz (64.1 kg)  02/15/24 146 lb (66.2 kg)  02/07/24 142 lb 2 oz (64.5 kg)    Physical Exam Vitals and nursing note reviewed.  Constitutional:      General: He is not in acute distress.    Appearance: He is well-developed. He is not diaphoretic.     Comments: Well-appearing, comfortable, cooperative  HENT:     Head: Normocephalic and atraumatic.  Eyes:     General:        Right eye: No discharge.        Left eye: No discharge.     Conjunctiva/sclera: Conjunctivae normal.  Neck:     Thyroid: No  thyromegaly.  Cardiovascular:     Rate and Rhythm: Normal rate and regular rhythm.     Pulses: Normal pulses.     Heart sounds: Normal heart sounds. No murmur heard. Pulmonary:     Effort: Pulmonary effort is normal. No respiratory distress.     Breath sounds: Normal breath sounds. No wheezing or rales.  Abdominal:     General: Bowel sounds are normal. There is no distension.     Palpations: Abdomen is soft.     Tenderness: There is no abdominal tenderness.     Hernia: A hernia (Superior to umbilicus, there is a ventral vs umbilical hernia defect on exam, non tender, reducibled appears to be superior to umbilicus) is present.  Musculoskeletal:        General: Normal range of motion.     Cervical back: Normal range of motion and neck supple.  Lymphadenopathy:     Cervical: No cervical adenopathy.  Skin:    General: Skin is warm and dry.     Findings: No erythema or rash.  Neurological:     Mental Status: He is alert and oriented to person, place, and time. Mental status is at baseline.  Psychiatric:        Behavior: Behavior normal.     Comments: Well groomed, good eye contact, normal speech and thoughts     Results for orders placed or performed during the hospital encounter of 02/16/24  CBC per protocol   Collection Time: 02/16/24 11:15 AM  Result Value Ref Range   WBC 4.0 4.0 - 10.5 K/uL   RBC 4.35 4.22 - 5.81 MIL/uL   Hemoglobin 11.7 (L) 13.0 - 17.0 g/dL   HCT 62.4 (L) 60.9 - 47.9 %   MCV 86.2 80.0 - 100.0 fL   MCH 26.9 26.0 - 34.0 pg   MCHC 31.2 30.0 - 36.0 g/dL   RDW 86.1 88.4 - 84.4 %   Platelets 240 150 - 400 K/uL   nRBC 0.0 0.0 - 0.2 %      Assessment & Plan:   Problem List Items Addressed This Visit   None Visit Diagnoses       Ventral hernia without obstruction or gangrene    -  Primary         Ventral hernia Located above the umbilicus, likely due to strain. Surgical intervention recommended to prevent  complications. He missed recent surgery due  to respiratory illness had to cancel, and he missed our pre-op appointments. Now asking about referral to new surgeon he had prior hernia repair years ago 2006 approx or 1990 he is unsure, [facility] now [place], he cannot recall name of surgeon and we attempted to look them up. I cannot find it in the records and he does not recall. He plans to call [facility] to ask about previous careers adviser. I advised that he has had prior hernia surgery from Dr Desiderio in past, and that I highly recommend Dr Desiderio and discussed that if he wants to pursue the ventral hernia repair we would likely need to re-schedule or follow back up with gen surgery to accomplish this. Or I could refer him to other location if that is what he prefers.  Elevated prostate specific antigen PSA level elevated at 5.4, with previous levels of 5.7 and 5.3. Further evaluation by a urologist needed for potential prostate cancer. He was referred to Urology already back on 12/10/23 and also on 02/07/24 and it has not be scheduled. Patient has not been adhering to follow-up - I advised him that further evaluation is warranted. I gave him # to contact [facility] to schedule himself since I have sent the referrals already previously     No orders of the defined types were placed in this encounter.   No orders of the defined types were placed in this encounter.   Follow up plan: Return if symptoms worsen or fail to improve.  He will contact us  back for Schedule June for yearly physical   Marsa Officer, DO [facility] 03/01/2024, 3:01 PM     [1]  Social History Tobacco Use   Smoking status: Former    Passive exposure: Past   Smokeless tobacco: Never  Vaping Use   Vaping status: Never Used  Substance Use Topics   Alcohol use: Never    Alcohol/week: 0.0 standard drinks of alcohol    Comment: rarely    Drug use: No   "
